# Patient Record
Sex: Male | Born: 2000 | Race: Black or African American | Hispanic: No | Marital: Single | Smoking: Never smoker
Health system: Southern US, Community
[De-identification: ages and names within clinical notes are randomized; demographics above are authoritative.]

## PROBLEM LIST (undated history)

## (undated) DIAGNOSIS — F32A Depression, unspecified: Secondary | ICD-10-CM

## (undated) DIAGNOSIS — E559 Vitamin D deficiency, unspecified: Secondary | ICD-10-CM

## (undated) DIAGNOSIS — E161 Other hypoglycemia: Secondary | ICD-10-CM

## (undated) DIAGNOSIS — R29898 Other symptoms and signs involving the musculoskeletal system: Secondary | ICD-10-CM

## (undated) DIAGNOSIS — F429 Obsessive-compulsive disorder, unspecified: Secondary | ICD-10-CM

## (undated) DIAGNOSIS — F840 Autistic disorder: Secondary | ICD-10-CM

## (undated) DIAGNOSIS — F411 Generalized anxiety disorder: Secondary | ICD-10-CM

## (undated) DIAGNOSIS — T7840XA Allergy, unspecified, initial encounter: Secondary | ICD-10-CM

## (undated) HISTORY — DX: Generalized anxiety disorder: F41.1

## (undated) HISTORY — DX: Allergy, unspecified, initial encounter: T78.40XA

## (undated) HISTORY — DX: Autistic disorder: F84.0

## (undated) HISTORY — DX: Other symptoms and signs involving the musculoskeletal system: R29.898

## (undated) HISTORY — DX: Vitamin D deficiency, unspecified: E55.9

## (undated) HISTORY — DX: Other hypoglycemia: E16.1

## (undated) HISTORY — DX: Obsessive-compulsive disorder, unspecified: F42.9

## (undated) HISTORY — DX: Depression, unspecified: F32.A

---
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
## 2022-02-15 ENCOUNTER — Ambulatory Visit (HOSPITAL_COMMUNITY)
Admission: EM | Admit: 2022-02-15 | Discharge: 2022-02-15 | Disposition: A | Discharge status: Home / Self Care | Payer: Medicaid Other | Attending: Psychiatry | Admitting: Psychiatry

## 2022-02-15 DIAGNOSIS — Z79899 Other long term (current) drug therapy: Secondary | ICD-10-CM | POA: Insufficient documentation

## 2022-02-15 DIAGNOSIS — F840 Autistic disorder: Secondary | ICD-10-CM | POA: Insufficient documentation

## 2022-02-15 DIAGNOSIS — F419 Anxiety disorder, unspecified: Secondary | ICD-10-CM | POA: Insufficient documentation

## 2022-02-15 NOTE — Progress Notes (Signed)
Patient was brought to the [facility].  Patient has been having meltdowns over the past three weeks when he comes home from his day program.  Grandmother states that he hits the wall, stomps his feet and becomes irrational  Grandmother states that they recently moved from an apartment to a house and he does not like it there and he is acting out more.  She states that he has rituals he does and when people do not go along with those, he gets really angry.  Grandmother states, "I am stressed out with him and I need a break."  Patient is not on any medication other than Vistaril for his anxiety.  She statates that patient currently has no psychiatric provider.  Patient was seeing Dr Darleene Cleaver for medications three years ago, but she states that they stopped going because patient was prescribed Topamax and he became violent.  Patient has not made any suicidal statements and he is ot homicidal or psychotic.  Patient's issues appear to be more behavioral related to his autism.  Patient is routine.

## 2022-02-15 NOTE — ED Provider Notes (Addendum)
Behavioral Health Urgent Care Medical Screening Exam  Patient Name: Christopher [name] MRN: [id number] Date of Evaluation: 02/15/22 Chief Complaint:   Diagnosis:  Final diagnoses:  Autism spectrum    History of Present illness: Christopher [name] is a 21 y.o. male. Patient presents voluntarily to [facility] for walk-in assessment.  Patient is accompanied by his grandmother, [name]. Christopher [name] prefers that his grandmother remain present during assessment.  Patient is assessed, face-to-face, by nurse practitioner, seated in assessment area, no acute distress. He  is alert and oriented, pleasant and cooperative during assessment. Speech slurred at baseline.  Christopher [name], prefers to be called "Christopher [name]."  History includes IDD, autism spectrum disorder, aggressive behavior, anxiety and OCD.  Current medications include hydroxyzine 25 mg 3 times daily as needed/anxiety.  This medication prescribed by pediatrician at [facility].  Patient has been linked with outpatient psychiatry, [name] at [facility] in the past.  No current medications aside from hydroxyzine.  Per patient's grandmother he has "an adverse reaction to 29 different psychiatric medications."  Patient's grandmother along with his mother/legal guardian meet weekly with Medicaid care coordinator, [name] with [facility].  Previously planned to place patient in [facility] however patient does not like attending day programs, negative experience at day program in the past.  Per care coordination patient would be required to attend day program while residing at alternative family living.  Currently patient attends a day support program through [facility] that allows him to remain in the office and "go out into the community" Christopher [name] enjoys this program immensely.  He states "I wish I could go every day."  Christopher [name] became very frustrated today during the drive home from his day support program.  Patient's grandmother shares  that patient is "very ritualistic and likes to have people sign his paperwork."  Patient has several pieces of blank paper that he asks day program staff members to sign at various times each day.  Today a staff member, [facility] [name], failed to sign his piece of paper.  Christopher [name] asked to return to the office however when his grandmother explained the office was closed he became very upset, began yelling and kicking in the car.  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 He states "my house is haunted and it is scary."  Christopher [name]'s grandmother is able to add details to current situation.  Christopher [name] and his family moved to a new home in April.  Since moving into a new home he reports fear of anything that flies including gnats or small fruit flies.  Patient now insists on closing the door to his bedroom very quickly because he is afraid to see a fly or a gnat. He is unaffected by flies and gnats at [facility] support program.  Patient's grandmother feels "he needs therapy to help him deal with his phobia."  He also insists on closing the front door very quickly to ensure that flies will not enter the home.  Patient  presents with euthymic mood, congruent affect. He  denies suicidal and homicidal ideations. Denies history of suicide attempts, denies history of self-harm.  Patient easily contracts verbally for safety with this Clinical research associate.    Patient  denies auditory and visual hallucinations.  Patient is able to converse coherently with goal-directed thoughts and no distractibility or preoccupation.   Objectively there is no evidence of psychosis/mania or delusional thinking.  [name] resides in [place] with his mother, grandmother and 64-year-old brother. He denies access to weapons. He receives disability benefits.  He enjoys attending  Day Support Program through [facility].. He denies alcohol and substance use. Patient endorses average sleep and appetite.  Legal guardian, Christopher [name] [phone] or  [phone].  Patient's mother/legal guardian is reluctant to seek medication management. She states  "every medicine that I have put [name] on up until now has not worked like I thought it would. We cannot deal with the consequences of a medication not working, once he was put on one medicine and it changed his whole personality. I am very skeptical about medicine, he should be able to get some other kinds of help."  Patient's grandmother, Christopher [name], agrees with plan to follow up with outpatient psychiatry. Patient and grandmother report a pleasant experience with [facility] [name] at [facility], would like to see this provider again moving forward.    Patient and family are educated and verbalize understanding of mental health resources and other crisis services in the community. They are instructed to call 911 and present to the nearest emergency room should patient experience any suicidal/homicidal ideation, auditory/visual/hallucinations, or detrimental worsening of mental health condition.    Psychiatric Specialty Exam  Presentation  General Appearance:Appropriate for Environment; Casual  Eye Contact:Fair  Speech:Normal Rate; Garbled  Speech Volume:Normal  Handedness:Right   Mood and Affect  Mood:Euthymic  Affect:Congruent   Thought Process  Thought Processes:Coherent; Goal Directed  Descriptions of Associations:Intact  Orientation:Full (Time, Place and Person)  Thought Content:Obsessions    Hallucinations:None  Ideas of Reference:None  Suicidal Thoughts:No  Homicidal Thoughts:No   Sensorium  Memory:Immediate Fair  Judgment:Impaired  Insight:Shallow   Executive Functions  Concentration:Poor  Attention Span:Poor  [place] recorded  Psychomotor Activity  Psychomotor Activity:Normal   Assets  Assets:Communication Skills; Financial Resources/Insurance; Housing; Intimacy; Leisure Time; Physical  Health; Resilience; Social Support   Sleep  Sleep:Fair  Number of hours: No data recorded  No data recorded  Physical Exam: Physical Exam Vitals and nursing note reviewed.  Constitutional:      Appearance: Normal appearance. He is well-developed.  HENT:     Head: Normocephalic and atraumatic.     Nose: Nose normal.  Cardiovascular:     Rate and Rhythm: Normal rate.  Pulmonary:     Effort: Pulmonary effort is normal.  Musculoskeletal:        General: Normal range of motion.  Skin:    General: Skin is warm and dry.  Neurological:     Mental Status: He is alert and oriented to person, place, and time.  Psychiatric:        Attention and Perception: Attention and perception normal.        Mood and Affect: Mood and affect normal.        Speech: Speech is slurred.        Behavior: Behavior normal. Behavior is cooperative.    Review of Systems  Constitutional: Negative.   HENT: Negative.    Eyes: Negative.   Respiratory: Negative.    Cardiovascular: Negative.   Gastrointestinal: Negative.   Genitourinary: Negative.   Musculoskeletal: Negative.   Skin: Negative.   Neurological: Negative.    Blood pressure 134/83, pulse 91, temperature 99.4 F (37.4 C), temperature source Oral, resp. rate 18, SpO2 99 %. There is no height or weight on file to calculate BMI.  Musculoskeletal: Strength & Muscle Tone: within normal limits Gait & Station: normal Patient leans: N/A   [facility] MSE Discharge Disposition for Follow up and Recommendations: Based on my evaluation the patient  does not appear to have an emergency medical condition and can be discharged with resources and follow up care in outpatient services for Medication Management and Individual Therapy  Patient reviewed with Dr Nelly Rout.  Follow up with established outpatient psychiatry or additional outpatient psychiatry resources provided. Continue current medications including hydroxyzine 25mg  TID PRN/anxiety.   , FNP 02/15/2022, 7:17 PM

## 2022-02-15 NOTE — Discharge Instructions (Addendum)

## 2022-05-21 HISTORY — PX: NO PAST SURGERIES: SHX2092

## 2022-07-05 ENCOUNTER — Ambulatory Visit: Payer: Medicaid Other | Admitting: Medical

## 2022-07-05 VITALS — BP 120/82 | HR 102 | Ht 76.0 in | Wt 271.6 lb

## 2022-07-05 DIAGNOSIS — F840 Autistic disorder: Secondary | ICD-10-CM

## 2022-07-05 DIAGNOSIS — E6609 Other obesity due to excess calories: Secondary | ICD-10-CM | POA: Diagnosis not present

## 2022-07-05 DIAGNOSIS — Z68.41 Body mass index (BMI) pediatric, greater than or equal to 95th percentile for age: Secondary | ICD-10-CM

## 2022-07-05 DIAGNOSIS — L83 Acanthosis nigricans: Secondary | ICD-10-CM

## 2022-07-05 DIAGNOSIS — H9325 Central auditory processing disorder: Secondary | ICD-10-CM | POA: Diagnosis not present

## 2022-07-05 NOTE — Progress Notes (Signed)
printed

## 2022-07-05 NOTE — Progress Notes (Signed)
Subjective:  Christopher [name] is a 22 y.o. male who presents for Chief Complaint  Patient presents with   get established    Get established. Needs a dentist- medicaid      Here as a new patient today.  Accompanied by mother.  He has been seeing pediatrics prior in [place].  Grew up in [place].  He lost his father last year.\  Mom is having a hard time getting him into a dentist  He has autism and has several people involved in his care with autism including [name], [facility], and has a case coordinator as well.  About 2 to 3 years ago he was seen at [name] for evaluation of quite elevated insulin.  They never really could find the cause as pituitary testing was otherwise normal.  Repeat testing finally was normal  Was seeing Dr. Jobie [name] prior for psychiatry.  He had prior gene testing for behavioral health medicines and he does not respond or react well to many medications.  Not currently seeing psychiatry  Eats a good variety, fruits and vegetables.  He and his mother have a weakness for sugary drinks but they try to do better  Regarding his current medications, uses Hydroxyzine QID as needed, during allergy season uses Zyrtec and flonase prn .  Has history of constipation uses MiraLAX as needed.  No other recent problem  No other aggravating or relieving factors.    No other c/o.  Past Medical History:  Diagnosis Date   Anxiety, generalized    Autism    Diagnosed at 29 months of age when mom noted he was not developing the same as his cousin   Hyperinsulinemia    OCD (obsessive compulsive disorder)    Tall stature    Current Outpatient Medications on File Prior to Visit  Medication Sig Dispense Refill   cetirizine (ZYRTEC) 10 MG tablet Take 10 mg by mouth daily.     fluticasone (FLONASE) 50 MCG/ACT nasal spray Place 1 spray into both nostrils daily.     hydrOXYzine (ATARAX) 50 MG tablet Take 50 mg by mouth 4 (four) times daily as needed.      polyethylene glycol powder (GLYCOLAX/MIRALAX) 17 GM/SCOOP powder Take 17 g by mouth every 7 (seven) days.     No current facility-administered medications on file prior to visit.     The following portions of the patient's history were reviewed and updated as appropriate: allergies, current medications, past family history, past medical history, past social history, past surgical history and problem list.  ROS Otherwise as in subjective above  Objective: BP 120/82   Pulse (!) 102   Ht 6' 4"$  (1.93 m)   Wt 271 lb 9.6 oz (123.2 kg)   BMI 33.06 kg/m   General appearance: alert, no distress, well developed, well nourished Neck: supple, no lymphadenopathy, no thyromegaly, no masses Heart: RRR, normal S1, S2, no murmurs Lungs: CTA bilaterally, no wheezes, rhonchi, or rales Pulses: 2+ radial pulses, 2+ pedal pulses, normal cap refill Ext: no edema   Assessment: Encounter Diagnoses  Name Primary?   Autism spectrum Yes   Auditory processing disorder    Obesity due to excess calories without serious comorbidity with body mass index (BMI) in 95th to 98th percentile for age in pediatric patient    Acanthosis nigricans      Plan: I reviewed his chart record.  We will try to work on a referral to a dentist since they are having a hard time finding a  dentist that will set up to his insurance.  He does get services through care coordinator for autism.  He has a history of elevated insulin with prior workup at [name].  I reviewed records in 2016 and 2017.  We will see him back in a few months for a fasting physical  Christopher [name] was seen today for get established.  Diagnoses and all orders for this visit:  Autism spectrum -     Ambulatory referral to Dentistry  Auditory processing disorder -     Ambulatory referral to Dentistry  Obesity due to excess calories without serious comorbidity with body mass index (BMI) in 95th to 98th percentile for age in pediatric patient -     Ambulatory  referral to Dentistry  Acanthosis nigricans   Follow up: In the next few months for fasting physical

## 2022-10-03 ENCOUNTER — Encounter: Payer: Self-pay | Admitting: Medical

## 2022-10-03 ENCOUNTER — Ambulatory Visit (INDEPENDENT_AMBULATORY_CARE_PROVIDER_SITE_OTHER): Payer: Medicaid Other | Admitting: Medical

## 2022-10-03 VITALS — BP 126/74 | HR 82 | Temp 98.8°F | Resp 16 | Ht 75.5 in | Wt 278.0 lb

## 2022-10-03 DIAGNOSIS — Z1329 Encounter for screening for other suspected endocrine disorder: Secondary | ICD-10-CM

## 2022-10-03 DIAGNOSIS — E161 Other hypoglycemia: Secondary | ICD-10-CM

## 2022-10-03 DIAGNOSIS — Z131 Encounter for screening for diabetes mellitus: Secondary | ICD-10-CM

## 2022-10-03 DIAGNOSIS — F4325 Adjustment disorder with mixed disturbance of emotions and conduct: Secondary | ICD-10-CM

## 2022-10-03 DIAGNOSIS — H9325 Central auditory processing disorder: Secondary | ICD-10-CM

## 2022-10-03 DIAGNOSIS — K921 Melena: Secondary | ICD-10-CM

## 2022-10-03 DIAGNOSIS — E569 Vitamin deficiency, unspecified: Secondary | ICD-10-CM

## 2022-10-03 DIAGNOSIS — R6250 Unspecified lack of expected normal physiological development in childhood: Secondary | ICD-10-CM

## 2022-10-03 DIAGNOSIS — E88819 Insulin resistance, unspecified: Secondary | ICD-10-CM | POA: Diagnosis not present

## 2022-10-03 DIAGNOSIS — L83 Acanthosis nigricans: Secondary | ICD-10-CM

## 2022-10-03 DIAGNOSIS — Z0000 Encounter for general adult medical examination without abnormal findings: Secondary | ICD-10-CM

## 2022-10-03 DIAGNOSIS — F840 Autistic disorder: Secondary | ICD-10-CM

## 2022-10-03 DIAGNOSIS — Z13220 Encounter for screening for lipoid disorders: Secondary | ICD-10-CM

## 2022-10-03 DIAGNOSIS — Z68.41 Body mass index (BMI) pediatric, greater than or equal to 95th percentile for age: Secondary | ICD-10-CM

## 2022-10-03 DIAGNOSIS — K5900 Constipation, unspecified: Secondary | ICD-10-CM

## 2022-10-03 LAB — LIPID PANEL
LDL Chol Calc (NIH): 94 mg/dL (ref 0–109)
VLDL Cholesterol Cal: 16 mg/dL (ref 5–40)

## 2022-10-03 LAB — CBC WITH DIFFERENTIAL/PLATELET
Hematocrit: 46.7 % (ref 37.5–51.0)
Lymphs: 43 %
MCV: 83 fL (ref 79–97)
Monocytes: 8 %
Platelets: 372 10*3/uL (ref 150–450)

## 2022-10-03 LAB — COMPREHENSIVE METABOLIC PANEL
Albumin: 4.4 g/dL (ref 4.3–5.2)
Alkaline Phosphatase: 66 IU/L (ref 51–125)
Bilirubin Total: 0.3 mg/dL (ref 0.0–1.2)
Calcium: 9.4 mg/dL (ref 8.7–10.2)

## 2022-10-03 LAB — HEMOGLOBIN A1C
Est. average glucose Bld gHb Est-mCnc: 120 mg/dL
Hgb A1c MFr Bld: 5.8 % — ABNORMAL HIGH (ref 4.8–5.6)

## 2022-10-03 MED ORDER — HYDROCORTISONE ACETATE 25 MG RE SUPP: 25.0000 mg | Freq: Two times a day (BID) | RECTAL | 0 refills | Status: DC

## 2022-10-03 MED ORDER — POLYETHYLENE GLYCOL 3350 17 GM/SCOOP PO POWD: 17.0000 g | Freq: Every day | ORAL | 2 refills | Status: DC

## 2022-10-03 NOTE — Progress Notes (Signed)
Subjective:   HPI  Christopher [name] is a 22 y.o. male who presents for Chief Complaint  Patient presents with   Annual Exam    Has additional concerns about blood in stool. Has past history of hemorrhoids. Also complains of pain under both arms.    Patient Care Team: Jalayne Ganesh, Kermit Balo, PA-C as PCP - General (Family Medicine) Freddie Apley as Speech Language Pathologist (Speech Pathology) Prior saw Dr. Starling Manns for counseling Has seen psychiatry in the past Has seen neuropsychiatry in the past with Dr. Jannifer Franklin   Concerns: Had prior [facility] test and has either had allergies to certain medication or Genesight showed some medications wouldn't help.   Some medications that should sedate him will make him aggressive. Particularly Topamax made him have erratic behavior.   He has been seeing some blood in stool.  Has had some hemorrhoids in the past.  Uses miralax some.  Was on daily, but now prn.  In the past month several times.  Doesn't seem to be in pain of late.  Sometimes stool feels a little hard.  If he uses miralax too frequently daily will get loose stool.  No complain with wiping.   Has some concerns of irritation under arms. Mom has hx/o hidradenitis.  She wants him checked.   Reviewed their medical, surgical, family, social, medication, and allergy history and updated chart as appropriate.  Allergies  Allergen Reactions   Abilify [Aripiprazole] Other (See Comments)    Caused aggression and Per "Genesight" pharmacogenomic testing, this is deemed to have a SIGNIFICANT gene-drug interaction   Anafranil [Clomipramine] Other (See Comments)    Per "Genesight" pharmacogenomic testing, this is deemed to have SIGNIFICANT gene-drug interaction   Celexa [Citalopram] Other (See Comments)    Per "Genesight" pharmacogenomic testing, this is deemed to have SIGNIFICANT gene-drug interaction   Clozaril [Clozapine] Other (See Comments)    Per "Genesight" pharmacogenomic testing, this is  deemed to have MODERATE gene-drug interaction   Concerta [Methylphenidate] Other (See Comments)    Per "Genesight" pharmacogenomic testing, this is deemed to have MODERATE gene-drug interaction   Cymbalta [Duloxetine Hcl] Other (See Comments)    Per "Genesight" pharmacogenomic testing, this is deemed to have MODERATE gene-drug interaction   Desyrel [Trazodone] Other (See Comments)    Per "Genesight" pharmacogenomic testing, this is deemed to have MODERATE gene-drug interaction   Effexor [Venlafaxine] Other (See Comments)    Per "Genesight" pharmacogenomic testing, this is deemed to have SIGNIFICANT gene-drug interaction   Elavil [Amitriptyline] Other (See Comments)    Per "Genesight" pharmacogenomic testing, this is deemed to have SIGNIFICANT gene-drug interaction   Emsam [Selegiline] Other (See Comments)    Per "Genesight" pharmacogenomic testing, this is deemed to have MODERATE gene-drug interaction   Fanapt [Iloperidone] Other (See Comments)    Per "Genesight" pharmacogenomic testing, this is deemed to have SIGNIFICANT gene-drug interaction   Focalin [Dexmethylphenidate] Other (See Comments)    Per "Genesight" pharmacogenomic testing, this is deemed to have MODERATE gene-drug interaction   Haldol [Haloperidol] Other (See Comments)    Per "Genesight" pharmacogenomic testing, this is deemed to have MODERATE gene-drug interaction   Inderal [Propranolol] Other (See Comments)    Per "Genesight" pharmacogenomic testing, this is deemed to have MODERATE gene-drug interaction   Lexapro [Escitalopram] Other (See Comments)    Per "Genesight" pharmacogenomic testing, this is deemed to have SIGNIFICANT gene-drug interaction   Luvox [Fluvoxamine] Other (See Comments)    Per "Genesight" pharmacogenomic testing, this is deemed to have MODERATE  gene-drug interaction   Mellaril [Thioridazine] Other (See Comments)    Per "Genesight" pharmacogenomic testing, this is deemed to have SIGNIFICANT gene-drug  interaction   Navane [Thiothixene (Tiotixene)] Other (See Comments)    Per "Genesight" pharmacogenomic testing, this is deemed to have SIGNIFICANT gene-drug interaction   Norpramin [Desipramine] Other (See Comments)    Per "Genesight" pharmacogenomic testing, this is deemed to have SIGNIFICANT gene-drug interaction   Other Itching and Other (See Comments)    Seasonal allergies- Itchy eyes, runny nose, some wheezing   Pamelor [Nortriptyline] Other (See Comments)    Per "Genesight" pharmacogenomic testing, this is deemed to have SIGNIFICANT gene-drug interaction   Paxil [Paroxetine] Other (See Comments)    Per "Genesight" pharmacogenomic testing, this is deemed to have SIGNIFICANT gene-drug interaction   Prolixin [Fluphenazine] Other (See Comments)    Per "Genesight" pharmacogenomic testing, this is deemed to have a moderate gene-drug interaction   Prozac [Fluoxetine] Other (See Comments)    Per "Genesight" pharmacogenomic testing, this is deemed to have SIGNIFICANT gene-drug interaction   Remeron [Mirtazapine] Other (See Comments)    Per "Genesight" pharmacogenomic testing, this is deemed to have MODERATE gene-drug interaction   Rexulti [Brexpiprazole] Other (See Comments)    Per "Genesight" pharmacogenomic testing, this is deemed to have SIGNIFICANT gene-drug interaction   Risperdal [Risperidone] Other (See Comments)    Per "Genesight" pharmacogenomic testing, this is deemed to have SIGNIFICANT gene-drug interaction   Saphris [Asenapine] Other (See Comments)    Per "Genesight" pharmacogenomic testing, this is deemed to have MODERATE gene-drug interaction   Seroquel [Quetiapine] Other (See Comments)    Per "Genesight" pharmacogenomic testing, this is deemed to have MODERATE gene-drug interaction   Sinequan [Doxepin] Other (See Comments)    Per "Genesight" pharmacogenomic testing, this is deemed to have SIGNIFICANT gene-drug interaction   Thorazine [Chlorpromazine] Other (See Comments)     Per "Genesight" pharmacogenomic testing, this is deemed to have MODERATE gene-drug interaction   Tofranil [Imipramine] Other (See Comments)    Per "Genesight" pharmacogenomic testing, this is deemed to have SIGNIFICANT gene-drug interaction   Trilafon [Perphenazine] Other (See Comments)    Per "Genesight" pharmacogenomic testing, this is deemed to have MODERATE gene-drug interaction   Trintellix [Vortioxetine] Other (See Comments)    Per "Genesight" pharmacogenomic testing, this is deemed to have SIGNIFICANT gene-drug interaction   Valium [Diazepam] Other (See Comments)    Per "Genesight" pharmacogenomic testing, this is deemed to have MODERATE gene-drug interaction   Viibryd [Vilazodone Hcl] Other (See Comments)    Per "Genesight" pharmacogenomic testing, this is deemed to have MODERATE gene-drug interaction   Wellbutrin [Bupropion] Other (See Comments)    Per "Genesight" pharmacogenomic testing, this is deemed to have SIGNIFICANT gene-drug interaction   Zoloft [Sertraline] Other (See Comments)    Per "Genesight" pharmacogenomic testing, this is deemed to have MODERATE gene-drug interaction   Zyprexa [Olanzapine] Other (See Comments)    Per "Genesight" pharmacogenomic testing, this is deemed to have MODERATE gene-drug interaction    Past Medical History:  Diagnosis Date   Allergy    Anxiety, generalized    Autism    Diagnosed at 65 months of age when mom noted he was not developing the same as his cousin   Hyperinsulinemia    eval at [facility] prior to 2024   OCD (obsessive compulsive disorder)    Tall stature     Current Outpatient Medications on File Prior to Visit  Medication Sig Dispense Refill   fluticasone (FLONASE) 50 MCG/ACT nasal  spray Place 1 spray into both nostrils daily.     hydrOXYzine (ATARAX) 50 MG tablet Take 50 mg by mouth 4 (four) times daily as needed.     polyethylene glycol powder (GLYCOLAX/MIRALAX) 17 GM/SCOOP powder Take 17 g by mouth every 7 (seven) days.      cetirizine (ZYRTEC) 10 MG tablet Take 10 mg by mouth daily. (Patient not taking: Reported on 10/03/2022)     No current facility-administered medications on file prior to visit.      Current Outpatient Medications:    fluticasone (FLONASE) 50 MCG/ACT nasal spray, Place 1 spray into both nostrils daily., Disp: , Rfl:    hydrocortisone (ANUSOL-HC) 25 MG suppository, Place 1 suppository (25 mg total) rectally 2 (two) times daily., Disp: 12 suppository, Rfl: 0   hydrOXYzine (ATARAX) 50 MG tablet, Take 50 mg by mouth 4 (four) times daily as needed., Disp: , Rfl:    polyethylene glycol powder (GLYCOLAX/MIRALAX) 17 GM/SCOOP powder, Take 17 g by mouth every 7 (seven) days., Disp: , Rfl:    polyethylene glycol powder (GLYCOLAX/MIRALAX) 17 GM/SCOOP powder, Take 17 g by mouth daily., Disp: 3350 g, Rfl: 2   cetirizine (ZYRTEC) 10 MG tablet, Take 10 mg by mouth daily. (Patient not taking: Reported on 10/03/2022), Disp: , Rfl:   Family History  Problem Relation Age of Onset   Hypertension Mother    Insulin resistance Mother    Cancer Father        renal   Kidney disease Father    Pancreatitis Father    Autism Brother    Hypertension Maternal Grandmother    Hypertension Maternal Grandfather    Diabetes Maternal Grandfather    Cancer Maternal Great-grandmother        ovarian cancer   Kidney disease Cousin     Past Surgical History:  Procedure Laterality Date   NO PAST SURGERIES  2024    Review of Systems  Constitutional:  Negative for chills, fever, malaise/fatigue and weight loss.  HENT:  Negative for congestion, ear pain, hearing loss, sore throat and tinnitus.   Eyes:  Negative for blurred vision, pain and redness.  Respiratory:  Negative for cough, hemoptysis and shortness of breath.   Cardiovascular:  Negative for chest pain, palpitations, orthopnea, claudication and leg swelling.  Gastrointestinal:  Positive for blood in stool and constipation. Negative for abdominal pain, diarrhea,  nausea and vomiting.  Genitourinary:  Negative for dysuria, flank pain, frequency, hematuria and urgency.  Musculoskeletal:  Negative for falls, joint pain and myalgias.  Skin:  Negative for itching and rash.  Neurological:  Negative for dizziness, tingling, speech change, weakness and headaches.  Endo/Heme/Allergies:  Negative for polydipsia. Does not bruise/bleed easily.  Psychiatric/Behavioral:  Negative for depression and memory loss. The patient is not nervous/anxious and does not have insomnia.      Objective:  BP 126/74   Pulse 82   Temp 98.8 F (37.1 C) (Oral)   Resp 16   Ht 6' 3.5" (1.918 m)   Wt 278 lb (126.1 kg)   SpO2 97% Comment: room air  BMI 34.29 kg/m   General appearance: alert, no distress, WD/WN, African American male Skin: unremarkable HEENT: normocephalic, conjunctiva/corneas normal, sclerae anicteric, PERRLA, EOMi, nares patent, no discharge or erythema, pharynx normal Oral cavity: MMM, tongue normal, teeth normal Neck: supple, no lymphadenopathy, no thyromegaly, no masses, normal ROM, no bruits Chest: non tender, normal shape and expansion Heart: RRR, normal S1, S2, no murmurs Lungs: CTA bilaterally, no wheezes, rhonchi, or  rales Abdomen: +bs, soft, non tender, non distended, no masses, no hepatomegaly, no splenomegaly, no bruits Back: non tender, normal ROM, no scoliosis Musculoskeletal: upper extremities non tender, no obvious deformity, normal ROM throughout, lower extremities non tender, no obvious deformity, normal ROM throughout Extremities: no edema, no cyanosis, no clubbing Pulses: 2+ symmetric, upper and lower extremities, normal cap refill Neurological: alert, oriented x 3, CN2-12 intact, strength normal upper extremities and lower extremities, sensation normal throughout, DTRs 2+ throughout, no cerebellar signs, gait normal Psychiatric: normal affect, behavior normal, pleasant  GU: deferred Rectal: anus normal appearing, on obvious hemorrhoid or  fissure. Examined with mom in the room as well.    Assessment and Plan :   Encounter Diagnoses  Name Primary?   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   Topics typically include healthy lifestyle, diet, exercise, preventative care, vaccinations, sick and well care, proper use of emergency dept and after hours care, as well as other concerns.     Separate significant issues discussed: Blood in stool, constipation - can continue miralax, can use proctors prn.  Discussed proper use.  Referral to GI  Armpit concern - no obvious abnormality today on exam  Autism - not currently seeing specialist.  He has had problems with many prior medicaiton.  Hx/o elevated insulin - additional labs today    General Recommendations: Continue to return yearly for your annual wellness and preventative care visits.  This gives [name] a chance to discuss healthy lifestyle, exercise, vaccinations, review your chart record, and perform screenings where appropriate.  I recommend you see your eye doctor yearly for routine vision care.  I recommend you see your dentist yearly for routine dental care including hygiene visits twice yearly.   Vaccination  Immunization History  Administered Date(s) Administered   DTaP 02/24/2003, 04/08/2003, 06/16/2003,  03/09/2004, 12/11/2006   HIB (PRP-OMP) 02/24/2003, 04/08/2003, 06/16/2003, 12/06/2003   HPV 9-valent 12/01/2014, 02/24/2015, 06/22/2015   Hep B, Unspecified 02/18/2020, 07/11/2020   Hepatitis A, Ped/Adol-2 Dose 06/07/2005, 12/06/2005   Hepatitis B, PED/ADOLESCENT August 08, 2002, 01/08/2003, 09/03/2003, 01/06/2020   IPV 02/24/2003, 03/09/2004, 04/07/2005, 12/11/2006   MMR 12/06/2003, 12/11/2006   Meningococcal B, Unspecified 12/15/2019, 03/01/2020   Meningococcal Conjugate 12/01/2014, 05/19/2019   PFIZER(Purple Top)SARS-COV-2 Vaccination 08/21/2019, 09/16/2019, 12/15/2020   Pneumococcal Conjugate-13 02/24/2003, 04/08/2003, 06/16/2003, 12/06/2003   Tdap 12/01/2014   Varicella 03/09/2004, 12/11/2006     Screening for cancer: Colon cancer screening: Referral to GI due to blood in stool and constipation   Testicular cancer screening You should do a monthly self testicular exam if you are between 19-59 years old, and we typically do a testicular exam on the yearly physical for this same age group.   Prostate Cancer screening: The recommended prostate cancer screening test is a blood  test called the prostate-specific antigen (PSA) test. PSA is a protein that is made in the prostate. As you age, your prostate naturally produces more PSA. Abnormally high PSA levels may be caused by: Prostate cancer. An enlarged prostate that is not caused by cancer (benign prostatic hyperplasia, or BPH). This condition is very common in older men. A prostate gland infection (prostatitis) or urinary tract infection. Certain medicines such as male hormones (like testosterone) or other medicines that raise testosterone levels. A rectal exam may be done as part of prostate cancer screening to help provide information about the size of your prostate gland. When a rectal exam is performed, it should be done after the PSA level is drawn to avoid any effect on the results.   Skin cancer screening: Check your skin  regularly for new changes, growing lesions, or other lesions of concern Come in for evaluation if you have skin lesions of concern.   Lung cancer screening: If you have a greater than 20 pack year history of tobacco use, then you may qualify for lung cancer screening with a chest CT scan.   Please call your insurance company to inquire about coverage for this test.   Pancreatic cancer:  no current screening test is available or routinely recommended. (risk factors: smoking, overweight or obese, diabetes, chronic pancreatitis, work exposure - dry cleaning, metal working, 20yo>, M>F, Tree surgeon, family hx/o, hereditary breast, ovarian, melanoma, lynch, peutz-jeghers).  Symptoms: jaundice, dark urine, light color or greasy stools, itchy skin, belly or back pain, weight loss, poor appetite, nausea, vomiting, liver enlargement, DVT/blood clots.   We currently don't have screenings for other cancers besides breast, cervical, colon, and lung cancers.  If you have a strong family history of cancer or have other cancer screening concerns, please let me know.  Genetic testing referral is an option for individuals with high cancer risk in the family.  There are some other cancer screenings in development currently.   Bone health: Get at least 150 minutes of aerobic exercise weekly Get weight bearing exercise at least once weekly Bone density test:  A bone density test is an imaging test that uses a type of X-ray to measure the amount of calcium and other minerals in your bones. The test may be used to diagnose or screen you for a condition that causes weak or thin bones (osteoporosis), predict your risk for a broken bone (fracture), or determine how well your osteoporosis treatment is working. The bone density test is recommended for females 65 and older, or females or males <65 if certain risk factors such as thyroid disease, long term use of steroids such as for asthma or rheumatological issues,  vitamin D deficiency, estrogen deficiency, family history of osteoporosis, self or family history of fragility fracture in first degree relative.    Heart health: Get at least 150 minutes of aerobic exercise weekly Limit alcohol It is important to maintain a healthy blood pressure and healthy cholesterol numbers  Heart disease screening: Screening for heart disease includes screening for blood pressure, fasting lipids, glucose/diabetes screening, BMI height to weight ratio, reviewed of smoking status, physical activity, and diet.    Goals include blood pressure 120/80 or less, maintaining a healthy lipid/cholesterol profile, preventing diabetes or keeping diabetes numbers under good control, not smoking or using tobacco products, exercising most days per week or at least 150 minutes per week of exercise, and eating healthy variety of fruits and vegetables, healthy oils, and avoiding unhealthy food choices like fried  food, fast food, high sugar and high cholesterol foods.    Other tests may possibly include EKG test, CT coronary calcium score, echocardiogram, exercise treadmill stress test.      Vascular disease screening: For higher risk individuals including smokers, diabetics, patients with known heart disease or high blood pressure, kidney disease, and others, screening for vascular disease or atherosclerosis of the arteries is available.  Examples may include carotid ultrasound, abdominal aortic ultrasound, ABI blood flow screening in the legs, thoracic aorta screening.    Medical care options: I recommend you continue to seek care here first for routine care.  We try really hard to have available appointments Monday through Friday daytime hours for sick visits, acute visits, and physicals.  Urgent care should be used for after hours and weekends for significant issues that cannot wait till the next day.  The emergency department should be used for significant potentially life-threatening  emergencies.  The emergency department is expensive, can often have long wait times for less significant concerns, so try to utilize primary care, urgent care, or telemedicine when possible to avoid unnecessary trips to the emergency department.  Virtual visits and telemedicine have been introduced since the pandemic started in 2020, and can be convenient ways to receive medical care.  We offer virtual appointments as well to assist you in a variety of options to seek medical care.   Legal  Take the time to do a last will and testament, Advanced Directives including Health Care Power of Attorney and Living Will documents.  Don't leave your family with burdens that can be handled ahead of time.   Advanced Directives: I recommend you consider completing a Health Care Power of Attorney and Living Will.   These documents respect your wishes and help alleviate burdens on your loved ones if you were to become terminally ill or be in a position to need those documents enforced.    You can complete Advanced Directives yourself, have them notarized, then have copies made for our office, for you and for anybody you feel should have them in safe keeping.  Or, you can have an attorney prepare these documents.   If you haven't updated your Last Will and Testament in a while, it may be worthwhile having an attorney prepare these documents together and save on some costs.       Spiritual and Emotional Health Keeping a healthy spiritual life can help you better manage your physical health. Your spiritual life can help you to cope with any issues that may arise with your physical health.  Balance can keep [name] healthy and help [name] to recover.  If you are struggling with your spiritual health there are questions that you may want to ask yourself:  What makes me feel most complete? When do I feel most connected to the rest of the world? Where do I find the most inner strength? What am I doing when I feel  whole?  Helpful tips: Being in nature. Some people feel very connected and at peace when they are walking outdoors or are outside. Helping others. Some feel the largest sense of wellbeing when they are of service to others. Being of service can take on many forms. It can be doing volunteer work, being kind to strangers, or offering a hand to a friend in need. Gratitude. Some people find they feel the most connected when they remain grateful. They may make lists of all the things they are grateful for or say a thank  you out loud for all they have.    Emotional Health Are you in tune with your emotional health?  Check out this link: http://www.marquez-love.[email or web address]/    Financial Health Make sure you use a budget for your personal finances Make sure you are insured against risks (health insurance, life insurance, auto insurance, etc) Save more, spend less Set financial goals If you need help in this area, good resources include counseling through [employer] or other community resources, have a meeting with a Social research officer, government, and a good resource is the [employer] was seen today for annual exam.  Diagnoses and all orders for this visit:  Encounter for health maintenance examination in adult -     Comprehensive metabolic panel -     CBC with Differential/Platelet -     Lipid panel -     Hemoglobin A1c -     TSH -     Prolactin -     POCT Urinalysis DIP (Proadvantage Device) -     Insulin, random -     VITAMIN D 25 Hydroxy (Vit-D Deficiency, Fractures) -     Ambulatory referral to Gastroenterology  Insulin resistance -     Hemoglobin A1c -     Insulin, random  Screening for diabetes mellitus -     Hemoglobin A1c  Acanthosis nigricans  Screening for lipid disorders -     Lipid panel  Screening for thyroid disorder -     TSH  Vitamin deficiency -     Prolactin -     Insulin, random -     VITAMIN D 25 Hydroxy (Vit-D Deficiency,  Fractures)  Hyperinsulinism  Autism spectrum  Auditory processing disorder  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 -     polyethylene glycol powder (GLYCOLAX/MIRALAX) 17 GM/SCOOP powder; Take 17 g by mouth daily.    Follow-up pending labs, yearly for physical

## 2022-10-04 ENCOUNTER — Other Ambulatory Visit: Payer: Self-pay | Admitting: Medical

## 2022-10-04 LAB — INSULIN, RANDOM: INSULIN: 92.7 u[IU]/mL — ABNORMAL HIGH (ref 2.6–24.9)

## 2022-10-04 LAB — COMPREHENSIVE METABOLIC PANEL
ALT: 43 IU/L (ref 0–44)
AST: 25 IU/L (ref 0–40)
Albumin/Globulin Ratio: 1.6 (ref 1.2–2.2)
BUN/Creatinine Ratio: 8 — ABNORMAL LOW (ref 9–20)
BUN: 7 mg/dL (ref 6–20)
CO2: 21 mmol/L (ref 20–29)
Chloride: 102 mmol/L (ref 96–106)
Creatinine, Ser: 0.89 mg/dL (ref 0.76–1.27)
Globulin, Total: 2.7 g/dL (ref 1.5–4.5)
Glucose: 84 mg/dL (ref 70–99)
Potassium: 4.8 mmol/L (ref 3.5–5.2)
Sodium: 137 mmol/L (ref 134–144)
Total Protein: 7.1 g/dL (ref 6.0–8.5)
eGFR: 127 mL/min/{1.73_m2} (ref >59)

## 2022-10-04 LAB — LIPID PANEL
Chol/HDL Ratio: 4.1 ratio (ref 0.0–5.0)
Cholesterol, Total: 145 mg/dL (ref 100–169)
HDL: 35 mg/dL — ABNORMAL LOW (ref >39)
Triglycerides: 83 mg/dL (ref 0–89)

## 2022-10-04 LAB — PROLACTIN: Prolactin: 16.5 ng/mL (ref 3.6–31.5)

## 2022-10-04 LAB — CBC WITH DIFFERENTIAL/PLATELET
Basophils Absolute: 0 10*3/uL (ref 0.0–0.2)
Basos: 1 %
EOS (ABSOLUTE): 0.1 10*3/uL (ref 0.0–0.4)
Eos: 2 %
Hemoglobin: 15.9 g/dL (ref 13.0–17.7)
Immature Grans (Abs): 0.1 10*3/uL (ref 0.0–0.1)
Immature Granulocytes: 1 %
Lymphocytes Absolute: 2.7 10*3/uL (ref 0.7–3.1)
MCH: 28.2 pg (ref 26.6–33.0)
MCHC: 34 g/dL (ref 31.5–35.7)
Monocytes Absolute: 0.5 10*3/uL (ref 0.1–0.9)
Neutrophils Absolute: 3 10*3/uL (ref 1.4–7.0)
Neutrophils: 45 %
RBC: 5.63 x10E6/uL (ref 4.14–5.80)
RDW: 13.3 % (ref 11.6–15.4)
WBC: 6.4 10*3/uL (ref 3.4–10.8)

## 2022-10-04 LAB — TSH: TSH: 2.24 u[IU]/mL (ref 0.450–4.500)

## 2022-10-04 LAB — VITAMIN D 25 HYDROXY (VIT D DEFICIENCY, FRACTURES): Vit D, 25-Hydroxy: 11.5 ng/mL — ABNORMAL LOW (ref 30.0–100.0)

## 2022-10-04 MED ORDER — VITAMIN D (ERGOCALCIFEROL) 1.25 MG (50000 UNIT) PO CAPS: 50000.0000 [IU] | ORAL_CAPSULE | ORAL | 3 refills | Status: DC

## 2022-10-04 NOTE — Progress Notes (Signed)
Sabrina-please call mom about these findings.  This patient is autistic   labs showed normal blood sugar, liver, kidney, electrolytes, normal blood counts.  Thyroid normal.  Prolactin pituitary hormone normal, but insulin still elevated as in the past.  Vitamin D was quite low.  Diabetes marker 5.8% which is somewhat at risk for diabetes.  Cholesterol okay except for good cholesterol HDL too low.  Vitamin D level is low. I would like you to begin prescription Vitamin D 50,000 IU weekly.  We will plan to check vitamin D periodically along with calcium, such as repeating labs in 3 months.  Foods that contain vitamin D include seafood such as oysters, shrimp, salmon, herring, cod, egg yolks, mushrooms, milk, and foods fortified with Vitamin D such as orange juice, cereals.  Its also important to get some sun exposure regularly to absorb vitamin D.  How you feel about a consult with a nutritionist.  I nothing is a bad idea to do a visit with the nutritionist to go over diet in relation to your labs  I reviewed some limited information in Care Everywhere from prior evaluation about the elevated insulin.  There apparently was some eval from [place] at 1 point did not have access to this.  Lets follow-up in 3 months

## 2022-10-06 LAB — SPECIMEN STATUS REPORT

## 2022-10-06 LAB — VITAMIN B12: Vitamin B-12: 276 pg/mL (ref 232–1245)

## 2022-10-06 NOTE — Progress Notes (Signed)
B12 was normal

## 2022-11-09 ENCOUNTER — Encounter: Payer: Self-pay | Admitting: Gastroenterology

## 2022-12-11 ENCOUNTER — Encounter: Payer: Self-pay | Admitting: Gastroenterology

## 2022-12-11 ENCOUNTER — Ambulatory Visit (INDEPENDENT_AMBULATORY_CARE_PROVIDER_SITE_OTHER): Payer: MEDICAID | Admitting: Gastroenterology

## 2022-12-11 VITALS — BP 104/68 | HR 84 | Ht 75.5 in | Wt 284.4 lb

## 2022-12-11 DIAGNOSIS — K625 Hemorrhage of anus and rectum: Secondary | ICD-10-CM

## 2022-12-11 DIAGNOSIS — K602 Anal fissure, unspecified: Secondary | ICD-10-CM

## 2022-12-11 DIAGNOSIS — K5900 Constipation, unspecified: Secondary | ICD-10-CM

## 2022-12-11 MED ORDER — CITRUCEL PO POWD: 1.0000 | Freq: Every day | ORAL | Status: AC

## 2022-12-11 MED ORDER — DILTIAZEM GEL 2 %: CUTANEOUS | 1 refills | Status: DC

## 2022-12-11 NOTE — Progress Notes (Signed)
HPI :  22 y/o male with a history of autism, anxiety, vitamin D deficiency, referred by Crosby Oyster PA for rectal bleeding.  He is accompanied by his mother today.  This is his first time seeing our office.  He has been experiencing blood in his stools for the past year.  Majority of history provided by his mother.  States this happens intermittently.  When she sees the blood the volume can vary but she has seen significant blood in the toilet bowl at times, and in the stool, as well as on the toilet paper.  She is not sure exactly how frequently it is happening as he goes to the bathroom without her assistance.  He was told he may have some hemorrhoids and was ordered some suppositories however she states insurance did not cover it and she was not able to pick it up.  He does have some occasional hard stools.  They state that he does have a bowel movement perhaps once daily.  He has been using MiraLAX on average 2 times per week, he still has some hard stools at times.  They are concerned about loose stools if using too frequently.  There is no family history of colon cancer.  He denies any significant rectal pain, states he does have some discomfort there at times.  No abdominal pains otherwise.  He has never had a prior colonoscopy.  He denies any cardiopulmonary symptoms or comorbidities of the leg.  Mother endorses history of vitamin D deficiency.  He had a CBC in May showing hemoglobin of 15.9 with an MCV of 83.   Lab Results  Component Value Date   WBC 6.4 10/03/2022   HGB 15.9 10/03/2022   HCT 46.7 10/03/2022   MCV 83 10/03/2022   PLT 372 10/03/2022     Past Medical History:  Diagnosis Date   Allergy    Anxiety, generalized    Autism    Diagnosed at 95 months of age when mom noted he was not developing the same as his cousin   Depression    Hyperinsulinemia    eval at [facility] prior to 2024   OCD (obsessive compulsive disorder)    Tall stature      Past Surgical  History:  Procedure Laterality Date   NO PAST SURGERIES  2024   Family History  Problem Relation Age of Onset   Hypertension Mother    Insulin resistance Mother    Kidney cancer Father    Kidney disease Father    Pancreatitis Father    Hypertension Maternal Grandmother    Hypothyroidism Maternal Grandmother    Atrial fibrillation Maternal Grandmother    Hypertension Maternal Grandfather    Diabetes Maternal Grandfather    Kidney failure Maternal Grandfather    Kidney disease Cousin    Cancer Maternal Great-grandmother        ovarian cancer   Social History   Tobacco Use   Smoking status: Never   Smokeless tobacco: Never  Vaping Use   Vaping status: Never Used  Substance Use Topics   Alcohol use: Never   Drug use: Never   Current Outpatient Medications  Medication Sig Dispense Refill   cetirizine (ZYRTEC) 10 MG tablet Take 10 mg by mouth as needed.     fluticasone (FLONASE) 50 MCG/ACT nasal spray Place 1 spray into both nostrils as needed.     hydrOXYzine (ATARAX) 50 MG tablet Take 50 mg by mouth 4 (four) times daily as needed.  polyethylene glycol powder (GLYCOLAX/MIRALAX) 17 GM/SCOOP powder Take 17 g by mouth as needed.     polyethylene glycol powder (GLYCOLAX/MIRALAX) 17 GM/SCOOP powder Take 17 g by mouth daily. 3350 g 2   Vitamin D, Ergocalciferol, (DRISDOL) 1.25 MG (50000 UNIT) CAPS capsule Take 1 capsule (50,000 Units total) by mouth every 7 (seven) days. 12 capsule 3   No current facility-administered medications for this visit.   Allergies  Allergen Reactions   Abilify [Aripiprazole] Other (See Comments)    Caused aggression and Per "[employer]" pharmacogenomic testing, this is deemed to have a SIGNIFICANT gene-drug interaction   Anafranil [Clomipramine] Other (See Comments)    Per "[employer]" pharmacogenomic testing, this is deemed to have SIGNIFICANT gene-drug interaction   Celexa [Citalopram] Other (See Comments)    Per "[employer]" pharmacogenomic  testing, this is deemed to have SIGNIFICANT gene-drug interaction   Clozaril [Clozapine] Other (See Comments)    Per "[employer]" pharmacogenomic testing, this is deemed to have MODERATE gene-drug interaction   Concerta [Methylphenidate] Other (See Comments)    Per "[employer]" pharmacogenomic testing, this is deemed to have MODERATE gene-drug interaction   Cymbalta [Duloxetine Hcl] Other (See Comments)    Per "[employer]" pharmacogenomic testing, this is deemed to have MODERATE gene-drug interaction   Desyrel [Trazodone] Other (See Comments)    Per "[employer]es[employer]" pharmacogenomic testing, this is deemed to have MODERATE gene-drug interaction   Effexor [Venlafaxine] Other (See Comments)    Per "[employer]" pharmacogenomic testing, this is deemed to have SIGNIFICANT gene-drug interaction   Elavil [Amitriptyline] Other (See Comments)    Per "[employer]" pharmacogenomic testing, this is deemed to have SIGNIFICANT gene-drug interaction   Emsam [Selegiline] Other (See Comments)    Per "[employer]" pharmacogenomic testing, this is deemed to have MODERATE gene-drug interaction   Fanapt [Iloperidone] Other (See Comments)    Per "[employer]" pharmacogenomic testing, this is deemed to have SIGNIFICANT gene-drug interaction   Focalin [Dexmethylphenidate] Other (See Comments)    Per "[employer]" pharmacogenomic testing, this is deemed to have MODERATE gene-drug interaction   Haldol [Haloperidol] Other (See Comments)    Per "[employer]" pharmacogenomic testing, this is deemed to have MODERATE gene-drug interaction   Inderal [Propranolol] Other (See Comments)    Per "[employer]es[employer]" pharmacogenomic testing, this is deemed to have MODERATE gene-drug interaction   Lexapro [Escitalopram] Other (See Comments)    Per "[employer]" pharmacogenomic testing, this is deemed to have SIGNIFICANT gene-drug interaction   Luvox [Fluvoxamine] Other (See Comments)    Per "[employer]" pharmacogenomic testing, this is deemed to have MODERATE  gene-drug interaction   Mellaril [Thioridazine] Other (See Comments)    Per "[employer]" pharmacogenomic testing, this is deemed to have SIGNIFICANT gene-drug interaction   Navane [Thiothixene (Tiotixene)] Other (See Comments)    Per "[employer]" pharmacogenomic testing, this is deemed to have SIGNIFICANT gene-drug interaction   Norpramin [Desipramine] Other (See Comments)    Per "[employer]" pharmacogenomic testing, this is deemed to have SIGNIFICANT gene-drug interaction   Other Itching and Other (See Comments)    Seasonal allergies- Itchy eyes, runny nose, some wheezing   Pamelor [Nortriptyline] Other (See Comments)    Per "[employer]" pharmacogenomic testing, this is deemed to have SIGNIFICANT gene-drug interaction   Paxil [Paroxetine] Other (See Comments)    Per "[employer]" pharmacogenomic testing, this is deemed to have SIGNIFICANT gene-drug interaction   Prolixin [Fluphenazine] Other (See Comments)    Per "[employer]" pharmacogenomic testing, this is deemed to have a moderate gene-drug interaction   Prozac [Fluoxetine] Other (See Comments)    Per "[employer]es[employer]"  pharmacogenomic testing, this is deemed to have SIGNIFICANT gene-drug interaction   Remeron [Mirtazapine] Other (See Comments)    Per "[employer]" pharmacogenomic testing, this is deemed to have MODERATE gene-drug interaction   Rexulti [Brexpiprazole] Other (See Comments)    Per "[employer]es[employer]" pharmacogenomic testing, this is deemed to have SIGNIFICANT gene-drug interaction   Risperdal [Risperidone] Other (See Comments)    Per "[employer]es[employer]" pharmacogenomic testing, this is deemed to have SIGNIFICANT gene-drug interaction   Saphris [Asenapine] Other (See Comments)    Per "[employer]es[employer]" pharmacogenomic testing, this is deemed to have MODERATE gene-drug interaction   Seroquel [Quetiapine] Other (See Comments)    Per "[employer]es[employer]" pharmacogenomic testing, this is deemed to have MODERATE gene-drug interaction   Sinequan [Doxepin] Other (See  Comments)    Per "[employer]es[employer]" pharmacogenomic testing, this is deemed to have SIGNIFICANT gene-drug interaction   Thorazine [Chlorpromazine] Other (See Comments)    Per "[employer]esight" pharmacogenomic testing, this is deemed to have MODERATE gene-drug interaction   Tofranil [Imipramine] Other (See Comments)    Per "[employer]es[employer]" pharmacogenomic testing, this is deemed to have SIGNIFICANT gene-drug interaction   Trilafon [Perphenazine] Other (See Comments)    Per "[employer]es[employer]" pharmacogenomic testing, this is deemed to have MODERATE gene-drug interaction   Trintellix [Vortioxetine] Other (See Comments)    Per "[employer]" pharmacogenomic testing, this is deemed to have SIGNIFICANT gene-drug interaction   Valium [Diazepam] Other (See Comments)    Per "[employer]es[employer]" pharmacogenomic testing, this is deemed to have MODERATE gene-drug interaction   Viibryd [Vilazodone Hcl] Other (See Comments)    Per "[employer]es[employer]" pharmacogenomic testing, this is deemed to have MODERATE gene-drug interaction   Wellbutrin [Bupropion] Other (See Comments)    Per "[employer]esight" pharmacogenomic testing, this is deemed to have SIGNIFICANT gene-drug interaction   Zoloft [Sertraline] Other (See Comments)    Per "[employer]es[employer]" pharmacogenomic testing, this is deemed to have MODERATE gene-drug interaction   Zyprexa [Olanzapine] Other (See Comments)    Per "[employer]es[employer]" pharmacogenomic testing, this is deemed to have MODERATE gene-drug interaction     Review of Systems: All systems reviewed and negative except where noted in HPI.   Lab Results  Component Value Date   WBC 6.4 10/03/2022   HGB 15.9 10/03/2022   HCT 46.7 10/03/2022   MCV 83 10/03/2022   PLT 372 10/03/2022     Physical Exam: BP 104/68 (BP Location: Left Arm, Patient Position: Sitting, Cuff Size: Large)   Pulse 84   Ht 6' 3.5" (1.918 m) Comment: height measured without shoes  Wt 284 lb 6 oz (129 kg)   BMI 35.08 kg/m  Constitutional: Pleasant,well-developed, male in no  acute distress. HEENT: Normocephalic and atraumatic. Conjunctivae are normal. No scleral icterus. Neck supple.  Cardiovascular: Normal rate, regular rhythm.  Pulmonary/chest: Effort normal and breath sounds normal.  Abdominal: Soft, nondistended, nontender.  There are no masses palpable. No hepatomegaly. DRE - Tedra Senegal CMA standby - posterior midleine anal fissure noted, hard stool in vault, anoscopy not performed Extremities: no edema Lymphadenopathy: No cervical adenopathy noted. Neurological: Alert and oriented to person place and time. Skin: Skin is warm and dry. No rashes noted. Psychiatric: Normal mood and affect. Behavior is normal.   ASSESSMENT: 22 y.o. male here for assessment of the following  1. Rectal bleeding   2. Constipation, unspecified constipation type   3. Anal fissure    Intermittent rectal bleeding for the past year in the setting of intermittent constipation.  CBC in May shows no anemia, hemoglobin normal.  DRE performed today which shows a  large posterior midline anal fissure.  I suspect this is the likely source of his bleeding symptoms.  No obvious external hemorrhoids noted.  Anoscopy not performed given findings of the fissure and potential to make that worse.  Had a long discussion with the patient and his mother about this.  They are concerned about having some loose stools with the MiraLAX.  Recommend trial of fiber in this light, will use Citrucel once daily to see if he can keep stools soft and minimize straining etc.  For the fissure recommend topical diltiazem/lidocaine ointment, pea-sized amount applied 3 times daily.  [facility]  Hopefully covered by insurance.  Had a discussion with the patient's mother on how to apply this, he will have a hard time doing this himself and mother feels that she will likely need to do this.  Will try to do it at least twice daily for a few weeks and see if this will help his symptoms.  If  his symptoms persist despite management of the fissure or worsen, we discussed potential role of colonoscopy/flex sig to further evaluate and ensure no other pathology.  Hopefully that is not needed.  If his symptoms persist over the next several weeks they will contact me for further assessment.  All questions answered they agree with plan.  PLAN: - Citrucel once daily OTC - can use Miralax PRN as well - prescribe Diltiazem / lidocaine ointment - pea sized amount PR TID - 1 tube with refill - if no improvement after 4 weeks then contact me. Consider colonoscopy pending his course if symptoms persist  Harlin Rain, MD  Gastroenterology  CC: Aleen Campi Kermit Balo, PA-C

## 2022-12-11 NOTE — Patient Instructions (Addendum)
_______________________________________________________  If your blood pressure at your visit was 140/90 or greater, please contact your primary care physician to follow up on this.  _______________________________________________________  If you are age 20 or older, your body mass index should be between 23-30. Your Body mass index is 35.08 kg/m. If this is out of the aforementioned range listed, please consider follow up with your Primary Care Provider.  If you are age 39 or younger, your body mass index should be between 19-25. Your Body mass index is 35.08 kg/m. If this is out of the aformentioned range listed, please consider follow up with your Primary Care Provider.   ________________________________________________________  The [facility] providers would like to encourage you to use [facility] to communicate with providers for non-urgent requests or questions.  Due to long hold times on the telephone, sending your provider a message by [facility] may be a faster and more efficient way to get a response.  Please allow 48 business hours for a response.  Please remember that this is for non-urgent requests.  _______________________________________________________   We have sent a prescription for Diltiazem gel to [facility]. You should apply a pea size amount to your rectum three times daily .  [facility]'s information is below: Address: [address], [place], [place] [address]  [phone]  *Please DO NOT go directly from our office to pick up this medication! Give the pharmacy 1 day to process the prescription as this is compounded and takes time to make.  Please purchase the following medications over the counter and take as directed: Citrucel : take once daily Please contact us in four weeks if your symptoms does not resolve.  Thank you for entrusting me with your care and for choosing [facility], Dr. Ileene Patrick

## 2022-12-14 ENCOUNTER — Telehealth: Payer: Self-pay | Admitting: Gastroenterology

## 2022-12-14 NOTE — Telephone Encounter (Signed)
Message routed to the PA Team.

## 2022-12-14 NOTE — Telephone Encounter (Signed)
I have called both numbers in his chart and I left  him a detailed message per [facility] this is never approved thru insurance because it is a compound rx. [facility] said it runs between $40-50. I told him to call back with any questions.

## 2022-12-14 NOTE — Telephone Encounter (Signed)
Pt needs a PA for diltiazem med. Ph# [phone]

## 2023-01-23 ENCOUNTER — Ambulatory Visit: Payer: MEDICAID | Admitting: Medical

## 2023-01-23 VITALS — BP 132/84 | HR 110 | Temp 98.7°F | Wt 287.6 lb

## 2023-01-23 DIAGNOSIS — L732 Hidradenitis suppurativa: Secondary | ICD-10-CM

## 2023-01-23 DIAGNOSIS — Z131 Encounter for screening for diabetes mellitus: Secondary | ICD-10-CM

## 2023-01-23 DIAGNOSIS — L723 Sebaceous cyst: Secondary | ICD-10-CM | POA: Diagnosis not present

## 2023-01-23 DIAGNOSIS — Z68.41 Body mass index (BMI) pediatric, greater than or equal to 95th percentile for age: Secondary | ICD-10-CM

## 2023-01-23 DIAGNOSIS — H9325 Central auditory processing disorder: Secondary | ICD-10-CM

## 2023-01-23 DIAGNOSIS — E6609 Other obesity due to excess calories: Secondary | ICD-10-CM | POA: Diagnosis not present

## 2023-01-23 DIAGNOSIS — E559 Vitamin D deficiency, unspecified: Secondary | ICD-10-CM | POA: Diagnosis not present

## 2023-01-23 DIAGNOSIS — F840 Autistic disorder: Secondary | ICD-10-CM

## 2023-01-23 DIAGNOSIS — E161 Other hypoglycemia: Secondary | ICD-10-CM

## 2023-01-23 LAB — POCT URINALYSIS DIP (PROADVANTAGE DEVICE)
Bilirubin, UA: NEGATIVE
Blood, UA: NEGATIVE
Glucose, UA: NEGATIVE mg/dL
Ketones, POC UA: NEGATIVE mg/dL
Leukocytes, UA: NEGATIVE
Nitrite, UA: NEGATIVE
Protein Ur, POC: NEGATIVE mg/dL
Specific Gravity, Urine: 1.01
Urobilinogen, Ur: NEGATIVE
pH, UA: 8 (ref 5.0–8.0)

## 2023-01-23 NOTE — Addendum Note (Signed)
Addended by: Herminio Commons A on: 01/23/2023 02:08 PM   Modules accepted: Orders

## 2023-01-23 NOTE — Progress Notes (Signed)
Subjective:  Christopher [name] is a 22 y.o. male who presents for Chief Complaint  Patient presents with   knot on head    Knot on head. Grandma noticed this yesterday, he's not sure on pain . They need a refill on hydoxyzine     Here with mother and grandmother for concerns.  Recently they noticed a knot on the back of his head.  Not particularly tender but they have not seen it before.  Wanted this checked out.  At his physical visit a few months ago he was very well on vitamin D.  He has been doing the weekly vitamin D prescription.  Here for recheck on this.  He is gaining weight and they are concerned about ways that he can lose weight.  He eats a lot.  He has not been exercising as much.  He gets bothered by the bugs outside.  He does go to the [facility] but has not been exercising lately with the indoor track  He was on metformin in the past with has been on that in a while.  He has a history of hidradenitis with occasional flare  He has been had prediabetes and elevated insulin in the past.  He saw gastroenterology recently.  They diagnosed him with aphasia.  He was put on Citrucel supplement due to constipation.  No other aggravating or relieving factors.    No other c/o.  Past Medical History:  Diagnosis Date   Allergy    Anxiety, generalized    Autism    Diagnosed at 25 months of age when mom noted he was not developing the same as his cousin   Depression    Hyperinsulinemia    eval at [facility] prior to 2024   OCD (obsessive compulsive disorder)    Tall stature    Current Outpatient Medications on File Prior to Visit  Medication Sig Dispense Refill   cetirizine (ZYRTEC) 10 MG tablet Take 10 mg by mouth as needed.     diltiazem 2 % GEL Diltiazem gel with lidocaine: use a pea size amount per rectum three times a day until symptoms   resolve 30 g 1   fluticasone (FLONASE) 50 MCG/ACT nasal spray Place 1 spray into both nostrils as needed.     hydrOXYzine (ATARAX) 50 MG tablet  Take 50 mg by mouth 4 (four) times daily as needed.     methylcellulose (CITRUCEL) oral powder Take 1 packet by mouth daily.     Vitamin D, Ergocalciferol, (DRISDOL) 1.25 MG (50000 UNIT) CAPS capsule Take 1 capsule (50,000 Units total) by mouth every 7 (seven) days. 12 capsule 3   polyethylene glycol powder (GLYCOLAX/MIRALAX) 17 GM/SCOOP powder Take 17 g by mouth daily. (Patient not taking: Reported on 01/23/2023) 3350 g 2   No current facility-administered medications on file prior to visit.     The following portions of the patient's history were reviewed and updated as appropriate: allergies, current medications, past family history, past medical history, past social history, past surgical history and problem list.  ROS Otherwise as in subjective above  Objective: BP 132/84   Pulse (!) 110   Temp 98.7 F (37.1 C)   Wt 287 lb 9.6 oz (130.5 kg)   BMI 35.47 kg/m   General appearance: alert, no distress, well developed, well nourished Neck: supple, no lymphadenopathy, no thyromegaly, no masses Occipital scalp with a approximately 1.5 cm not suggestive of sebaceous cyst.  No obvious lymphadenopathy.  No drainage no redness no warmth.  No fluctuance.    Assessment: Encounter Diagnoses  Name Primary?   Hidradenitis Yes   Sebaceous cyst    Vitamin D deficiency    Screening for diabetes mellitus    Auditory processing disorder    Autism spectrum    Hyperinsulinism    Obesity due to excess calories without serious comorbidity with body mass index (BMI) in 95th to 98th percentile for age in pediatric patient      Plan: Hidradenitis-no recent concerns.  Consider going back on metformin as a preventative measure along with efforts to lose weight  We discussed the knot on the back of the scalp that looks most like a sebaceous cyst.  Reassured.  We discussed possible changes that a cyst can have such as infection but sometimes they can resolve as well.  Updated labs to screen for  diabetes today.  Vitamin D deficiency-updated labs since he has been on supplement for the last few months  Obesity-I will touch base with his endocrinologist about possibly going back on metformin to help with blood sugar control and hidradenitis or a GLP-1 medication for weight loss.  Counseled on exercise such as getting back into walking indoor track at the [facility] was seen today for knot on head.  Diagnoses and all orders for this visit:  Hidradenitis  Sebaceous cyst  Vitamin D deficiency -     VITAMIN D 25 Hydroxy (Vit-D Deficiency, Fractures) -     Basic metabolic panel  Screening for diabetes mellitus -     Hemoglobin A1c -     Basic metabolic panel  Auditory processing disorder  Autism spectrum  Hyperinsulinism  Obesity due to excess calories without serious comorbidity with body mass index (BMI) in 95th to 98th percentile for age in pediatric patient    Follow up: pending labs

## 2023-01-24 ENCOUNTER — Other Ambulatory Visit: Payer: Self-pay | Admitting: Medical

## 2023-01-24 DIAGNOSIS — E161 Other hypoglycemia: Secondary | ICD-10-CM

## 2023-01-24 LAB — BASIC METABOLIC PANEL
BUN/Creatinine Ratio: 5 — ABNORMAL LOW (ref 9–20)
BUN: 5 mg/dL — ABNORMAL LOW (ref 6–20)
CO2: 23 mmol/L (ref 20–29)
Calcium: 9.5 mg/dL (ref 8.7–10.2)
Chloride: 103 mmol/L (ref 96–106)
Creatinine, Ser: 0.96 mg/dL (ref 0.76–1.27)
Glucose: 96 mg/dL (ref 70–99)
Potassium: 4.4 mmol/L (ref 3.5–5.2)
Sodium: 140 mmol/L (ref 134–144)
eGFR: 116 mL/min/{1.73_m2} (ref >59)

## 2023-01-24 LAB — HEMOGLOBIN A1C
Est. average glucose Bld gHb Est-mCnc: 117 mg/dL
Hgb A1c MFr Bld: 5.7 % — ABNORMAL HIGH (ref 4.8–5.6)

## 2023-01-24 LAB — VITAMIN D 25 HYDROXY (VIT D DEFICIENCY, FRACTURES): Vit D, 25-Hydroxy: 34 ng/mL (ref 30.0–100.0)

## 2023-01-24 MED ORDER — METFORMIN HCL 500 MG PO TABS: 500.0000 mg | ORAL_TABLET | Freq: Every day | ORAL | 0 refills | Status: DC

## 2023-01-24 NOTE — Progress Notes (Signed)
Hello [name]  I saw our mutual patient yesterday.  How do you feel about either metformin to help control blood sugars or starting him on a medicine like Wegovy or Zepbound if insurance will cover this to help with weight loss and sugar control?  I told the family I would reach out to you about this  Thanks Vincenza Hews

## 2023-01-24 NOTE — Progress Notes (Signed)
After speaking with endocrinology lets have Christopher [name] begin metformin once a day for the next 30 days.  Lets see him back in 1 month to discuss and at that point we can try to get other medicines approved by insurance  I also am placing referral to adult endocrinology as he has aged out of pediatric endocrinology

## 2023-01-24 NOTE — Progress Notes (Signed)
Diabetes marker stable but at risk for diabetes.  Electrolytes okay.  Vitamin D improved.  Continue the same vitamin D supplement.  I will reach out to endocrinology as we discussed yesterday

## 2023-02-05 ENCOUNTER — Ambulatory Visit (INDEPENDENT_AMBULATORY_CARE_PROVIDER_SITE_OTHER): Payer: Self-pay | Admitting: Family

## 2023-02-20 ENCOUNTER — Encounter: Payer: Self-pay | Admitting: Medical

## 2023-02-20 ENCOUNTER — Telehealth: Payer: Self-pay | Admitting: Gastroenterology

## 2023-02-20 ENCOUNTER — Ambulatory Visit (INDEPENDENT_AMBULATORY_CARE_PROVIDER_SITE_OTHER): Payer: MEDICAID | Admitting: Medical

## 2023-02-20 VITALS — BP 120/80 | HR 89 | Wt 282.2 lb

## 2023-02-20 DIAGNOSIS — Z6834 Body mass index (BMI) 34.0-34.9, adult: Secondary | ICD-10-CM | POA: Insufficient documentation

## 2023-02-20 DIAGNOSIS — E161 Other hypoglycemia: Secondary | ICD-10-CM | POA: Diagnosis not present

## 2023-02-20 DIAGNOSIS — R7301 Impaired fasting glucose: Secondary | ICD-10-CM

## 2023-02-20 DIAGNOSIS — L83 Acanthosis nigricans: Secondary | ICD-10-CM

## 2023-02-20 DIAGNOSIS — Z23 Encounter for immunization: Secondary | ICD-10-CM | POA: Diagnosis not present

## 2023-02-20 DIAGNOSIS — E559 Vitamin D deficiency, unspecified: Secondary | ICD-10-CM

## 2023-02-20 MED ORDER — METFORMIN HCL 500 MG PO TABS: 500.0000 mg | ORAL_TABLET | Freq: Every day | ORAL | 0 refills | Status: DC

## 2023-02-20 NOTE — Telephone Encounter (Signed)
Called and spoke to patient's mother.  He has started on Metformin which has caused some loose stool. She was wondering if he needs to stay on the Citrucel once he finished the bottle.  She has stopped using the Miralax and understands to add that when his stool gets too hard.  For now I encouraged her to continue the Citrucel daily. All other questions answered

## 2023-02-20 NOTE — Progress Notes (Signed)
Subjective:  Christopher [name] is a 22 y.o. male who presents for Chief Complaint  Patient presents with   Prediabetes    Per diabetes follow-up, flu and covid shot     Here for chronic issue follow up.  Here with mom and grandmother.  At risk for diabetes, elevated insulin levels-he was formally seeing pediatric endocrinology but he is aged out now.  Last visit with the help of coordination from his prior pediatric endocrinologist we added metformin.  Compliant with metformin 500 mg once daily.  We discussed possibly looking at GLP-1 medication to help with weight loss and sugar control.  Vitamin D deficiency-compliant with vitamin D 50,000 weekly.  He has a lab that was showing low vitamin D 4 months ago.  He has recent lab a month ago showed improvements into the normal range but still on the low end of normal.  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  On metformin the past month his stools are not as hard and this may be helping.  Not really using miralax.   Mom notes that he had a bump on back of head, pustule, but wants this checked.    No other aggravating or relieving factors.    No other c/o.  Past Medical History:  Diagnosis Date   Allergy    Anxiety, generalized    Autism    Diagnosed at 35 months of age when mom noted he was not developing the same as his cousin   Depression    Hyperinsulinemia    eval at [facility] prior to 2024   OCD (obsessive compulsive disorder)    Tall stature    Vitamin D deficiency    Current Outpatient Medications on File Prior to Visit  Medication Sig Dispense Refill   cetirizine (ZYRTEC) 10 MG tablet Take 10 mg by mouth as needed.     fluticasone (FLONASE) 50 MCG/ACT nasal spray Place 1 spray into both nostrils as needed.     hydrOXYzine (ATARAX) 50 MG tablet Take 50 mg by mouth 4 (four) times daily as needed.     methylcellulose (CITRUCEL) oral  powder Take 1 packet by mouth daily.     Vitamin D, Ergocalciferol, (DRISDOL) 1.25 MG (50000 UNIT) CAPS capsule Take 1 capsule (50,000 Units total) by mouth every 7 (seven) days. 12 capsule 3   diltiazem 2 % GEL Diltiazem gel with lidocaine: use a pea size amount per rectum three times a day until symptoms   resolve (Patient not taking: Reported on 02/20/2023) 30 g 1   No current facility-administered medications on file prior to visit.    The following portions of the patient's history were reviewed and updated as appropriate: allergies, current medications, past family history, past medical history, past social history, past surgical history and problem list.  ROS Otherwise as in subjective above    Objective: BP 120/80   Pulse 89   Wt 282 lb 3.2 oz (128 kg)   BMI 34.81 kg/m   Wt Readings from Last 3 Encounters:  02/20/23 282 lb 3.2 oz (128 kg)  01/23/23 287 lb 9.6 oz (130.5 kg)  12/11/22 284 lb 6 oz (129 kg)    General appearance: alert, no distress, well developed, well nourished Skin lesion: Right posterior scalp within the hairline with a small 4 mm x 3 mm brown raised benign-appearing papular lesion, likely seborrheic keratosis  Assessment: Encounter Diagnoses  Name Primary?   Hyperinsulinism Yes   COVID-19 vaccine administered    Needs flu shot    Acanthosis nigricans    BMI 34.0-34.9,adult    Vitamin D deficiency    Impaired fasting blood sugar      Plan: Hyperinsulinemia-prior management by pediatric endocrinology.  We have made a referral to adult endocrinology, appointment coming up.  Last visit blood sugars are at risk for diabetes.  We initiated metformin.  So far doing okay on this and actually stools are doing better on metformin.  We discussed possibly a GLP-1 medication instead of metformin which could potentially help weight loss, blood sugar control and hidradenitis.  They will discuss with endocrinology coming up.  Vitamin D deficiency-reviewed  recent labs with some improvement.  Continue vitamin D supplement  Counseled on the influenza virus vaccine.  Vaccine information sheet given.  Influenza vaccine given after consent obtained.  Counseled on the Covid virus vaccine.  Vaccine information sheet given.  Covid vaccine given after consent obtained.   [name] was seen today for prediabetes.  Diagnoses and all orders for this visit:  Hyperinsulinism  COVID-19 vaccine administered -     Pfizer Comirnaty Covid -19 Vaccine 87yrs and older  Needs flu shot -     Flu vaccine trivalent PF, 6mos and older(Flulaval,Afluria,Fluarix,Fluzone)  Acanthosis nigricans  BMI 34.0-34.9,adult  Vitamin D deficiency  Impaired fasting blood sugar  Other orders -     metFORMIN (GLUCOPHAGE) 500 MG tablet; Take 1 tablet (500 mg total) by mouth daily with breakfast.   Follow up: with new consult with adult endocrinology

## 2023-02-20 NOTE — Patient Instructions (Signed)
[facility] [address] [place], [place] [address] [phone]

## 2023-02-20 NOTE — Telephone Encounter (Signed)
Inbound call from patient's mother, is wanting to speak with someone in regards to patient's medicine prescribed by Dr. Adela Lank on his last visit and if he were to need a refill again. Please advise.

## 2023-04-23 ENCOUNTER — Ambulatory Visit (INDEPENDENT_AMBULATORY_CARE_PROVIDER_SITE_OTHER): Payer: 59 | Admitting: Physician Assistant

## 2023-04-23 ENCOUNTER — Encounter: Payer: Self-pay | Admitting: Physician Assistant

## 2023-04-23 VITALS — BP 116/80 | HR 95 | Ht 75.5 in | Wt 284.2 lb

## 2023-04-23 DIAGNOSIS — K5900 Constipation, unspecified: Secondary | ICD-10-CM

## 2023-04-23 DIAGNOSIS — K625 Hemorrhage of anus and rectum: Secondary | ICD-10-CM | POA: Diagnosis not present

## 2023-04-23 DIAGNOSIS — K602 Anal fissure, unspecified: Secondary | ICD-10-CM | POA: Diagnosis not present

## 2023-04-23 NOTE — Patient Instructions (Signed)
Start  Citrucel 2 teaspoons in 8 ounces of liquid daily.  Start Miralax 1 capful every other day in 8 ounces of liquid.  You have been scheduled for a colonoscopy. Please follow written instructions given to you at your visit today.   Please pick up your prep supplies at the pharmacy within the next 1-3 days.  If you use inhalers (even only as needed), please bring them with you on the day of your procedure.  DO NOT TAKE 7 DAYS PRIOR TO TEST- Trulicity (dulaglutide) Ozempic, Wegovy (semaglutide) Mounjaro (tirzepatide) Bydureon Bcise (exanatide extended release)  DO NOT TAKE 1 DAY PRIOR TO YOUR TEST Rybelsus (semaglutide) Adlyxin (lixisenatide) Victoza (liraglutide) Byetta (exanatide) __________________________________________________________________  _______________________________________________________  If your blood pressure at your visit was 140/90 or greater, please contact your primary care physician to follow up on this.  _______________________________________________________  If you are age 73 or older, your body mass index should be between 23-30. Your Body mass index is 35.06 kg/m. If this is out of the aforementioned range listed, please consider follow up with your Primary Care Provider.  If you are age 30 or younger, your body mass index should be between 19-25. Your Body mass index is 35.06 kg/m. If this is out of the aformentioned range listed, please consider follow up with your Primary Care Provider.   ________________________________________________________  The [facility] providers would like to encourage you to use [employer] to communicate with providers for non-urgent requests or questions.  Due to long hold times on the telephone, sending your provider a message by [facility] may be a faster and more efficient way to get a response.  Please allow 48 business hours for a response.  Please remember that this is for non-urgent requests.   _______________________________________________________

## 2023-04-24 ENCOUNTER — Encounter: Payer: Self-pay | Admitting: Physician Assistant

## 2023-04-24 NOTE — Progress Notes (Signed)
Agree with assessment and plan as outlined.  

## 2023-04-24 NOTE — Progress Notes (Signed)
Subjective:    Patient ID: Christopher [name], male    DOB: 07/11/2000, 22 y.o.   MRN: [id number]  HPI Christopher [name] is a 22 year old African-American male, recently established with Dr. Adela Lank and seen in the office on 12/11/2022.  Patient has autism, anxiety, OCD, depression and hyperinsulinemia.  He was seen for intermittent bright red blood in his stools, and some issues with constipation.  He really did not complain of any significant rectal pain but on exam during that visit was found to have a posterior midline anal fissure, and hard stool in vault.  Plan was to treat the fissure, and start him on MiraLAX and Citrucel.  Colonoscopy was to be considered pending his course and was discussed at that time.  Patient is accompanied by his grandmother today whom he also lives with and she offers most of the history.  He apparently finished all of the diltiazem/lidocaine.  She reports that they are still noticing blood intermittently in his stools which is always bright red and does not appear to be mixed with the bowel movements.  His grandmother says they do not see every bowel movement but ask him to let them see his stools periodically throughout the week.  His grandmother reports that he is not having a bowel movement every day , but generally has at least 2-3 bowel movements per week.  They are currently not using the Citrucel or MiraLAX on an every day basis, but rather as needed.  When asked he is not complaining of any significant rectal pain or discomfort says he has" some" off and on. He has no complaints of abdominal pain.   Review of Systems. Pertinent positive and negative review of systems were noted in the above HPI section.  All other review of systems was otherwise negative.   Outpatient Encounter Medications as of 04/23/2023  Medication Sig   cetirizine (ZYRTEC) 10 MG tablet Take 10 mg by mouth as needed.   fluticasone (FLONASE) 50 MCG/ACT nasal spray Place 1 spray into both nostrils as  needed.   metFORMIN (GLUCOPHAGE) 500 MG tablet Take 1 tablet (500 mg total) by mouth daily with breakfast.   methylcellulose (CITRUCEL) oral powder Take 1 packet by mouth daily.   propranolol (INDERAL) 10 MG tablet Take 5 mg by mouth 2 (two) times daily.   sertraline (ZOLOFT) 50 MG tablet Take 25 mg by mouth daily.   Vitamin D, Ergocalciferol, (DRISDOL) 1.25 MG (50000 UNIT) CAPS capsule Take 1 capsule (50,000 Units total) by mouth every 7 (seven) days.   diltiazem 2 % GEL Diltiazem gel with lidocaine: use a pea size amount per rectum three times a day until symptoms   resolve (Patient not taking: Reported on 02/20/2023)   [DISCONTINUED] hydrOXYzine (ATARAX) 50 MG tablet Take 50 mg by mouth 4 (four) times daily as needed.   No facility-administered encounter medications on file as of 04/23/2023.   Allergies  Allergen Reactions   Abilify [Aripiprazole] Other (See Comments)    Caused aggression and Per "[employer]ight" pharmacogenomic testing, this is deemed to have a SIGNIFICANT gene-drug interaction   Anafranil [Clomipramine] Other (See Comments)    Per "[employer]" pharmacogenomic testing, this is deemed to have SIGNIFICANT gene-drug interaction   Celexa [Citalopram] Other (See Comments)    Per "[employer]esight" pharmacogenomic testing, this is deemed to have SIGNIFICANT gene-drug interaction   Clozaril [Clozapine] Other (See Comments)    Per "[employer]es[employer]" pharmacogenomic testing, this is deemed to have MODERATE gene-drug interaction   Concerta [Methylphenidate] Other (See Comments)  Per "[employer]esight" pharmacogenomic testing, this is deemed to have MODERATE gene-drug interaction   Cymbalta [Duloxetine Hcl] Other (See Comments)    Per "[employer]esight" pharmacogenomic testing, this is deemed to have MODERATE gene-drug interaction   Desyrel [Trazodone] Other (See Comments)    Per "Genesight" pharmacogenomic testing, this is deemed to have MODERATE gene-drug interaction   Effexor [Venlafaxine] Other (See  Comments)    Per "Genesight" pharmacogenomic testing, this is deemed to have SIGNIFICANT gene-drug interaction   Elavil [Amitriptyline] Other (See Comments)    Per "Genesight" pharmacogenomic testing, this is deemed to have SIGNIFICANT gene-drug interaction   Emsam [Selegiline] Other (See Comments)    Per "Genesight" pharmacogenomic testing, this is deemed to have MODERATE gene-drug interaction   Fanapt [Iloperidone] Other (See Comments)    Per "Genesight" pharmacogenomic testing, this is deemed to have SIGNIFICANT gene-drug interaction   Focalin [Dexmethylphenidate] Other (See Comments)    Per "[employer]esight" pharmacogenomic testing, this is deemed to have MODERATE gene-drug interaction   Haldol [Haloperidol] Other (See Comments)    Per "[employer]esight" pharmacogenomic testing, this is deemed to have MODERATE gene-drug interaction   Inderal [Propranolol] Other (See Comments)    Per "Genesight" pharmacogenomic testing, this is deemed to have MODERATE gene-drug interaction   Lexapro [Escitalopram] Other (See Comments)    Per "Genesight" pharmacogenomic testing, this is deemed to have SIGNIFICANT gene-drug interaction   Luvox [Fluvoxamine] Other (See Comments)    Per "Genesight" pharmacogenomic testing, this is deemed to have MODERATE gene-drug interaction   Mellaril [Thioridazine] Other (See Comments)    Per "Genesight" pharmacogenomic testing, this is deemed to have SIGNIFICANT gene-drug interaction   Navane [Thiothixene (Tiotixene)] Other (See Comments)    Per "Genesight" pharmacogenomic testing, this is deemed to have SIGNIFICANT gene-drug interaction   Norpramin [Desipramine] Other (See Comments)    Per "Genesight" pharmacogenomic testing, this is deemed to have SIGNIFICANT gene-drug interaction   Other Itching and Other (See Comments)    Seasonal allergies- Itchy eyes, runny nose, some wheezing   Pamelor [Nortriptyline] Other (See Comments)    Per "[employer]esight" pharmacogenomic testing, this is  deemed to have SIGNIFICANT gene-drug interaction   Paxil [Paroxetine] Other (See Comments)    Per "[employer]esight" pharmacogenomic testing, this is deemed to have SIGNIFICANT gene-drug interaction   Prolixin [Fluphenazine] Other (See Comments)    Per "Genesight" pharmacogenomic testing, this is deemed to have a moderate gene-drug interaction   Prozac [Fluoxetine] Other (See Comments)    Per "Genesight" pharmacogenomic testing, this is deemed to have SIGNIFICANT gene-drug interaction   Remeron [Mirtazapine] Other (See Comments)    Per "Genesight" pharmacogenomic testing, this is deemed to have MODERATE gene-drug interaction   Rexulti [Brexpiprazole] Other (See Comments)    Per "Genesight" pharmacogenomic testing, this is deemed to have SIGNIFICANT gene-drug interaction   Risperdal [Risperidone] Other (See Comments)    Per "Genesight" pharmacogenomic testing, this is deemed to have SIGNIFICANT gene-drug interaction   Saphris [Asenapine] Other (See Comments)    Per "Genesight" pharmacogenomic testing, this is deemed to have MODERATE gene-drug interaction   Seroquel [Quetiapine] Other (See Comments)    Per "Genesight" pharmacogenomic testing, this is deemed to have MODERATE gene-drug interaction   Sinequan [Doxepin] Other (See Comments)    Per "Genesight" pharmacogenomic testing, this is deemed to have SIGNIFICANT gene-drug interaction   Thorazine [Chlorpromazine] Other (See Comments)    Per "Genesight" pharmacogenomic testing, this is deemed to have MODERATE gene-drug interaction   Tofranil [Imipramine] Other (See Comments)    Per "Genesight" pharmacogenomic testing,  this is deemed to have SIGNIFICANT gene-drug interaction   Trilafon [Perphenazine] Other (See Comments)    Per "Genesight" pharmacogenomic testing, this is deemed to have MODERATE gene-drug interaction   Trintellix [Vortioxetine] Other (See Comments)    Per "[employer]esight" pharmacogenomic testing, this is deemed to have SIGNIFICANT  gene-drug interaction   Valium [Diazepam] Other (See Comments)    Per "Genesight" pharmacogenomic testing, this is deemed to have MODERATE gene-drug interaction   Viibryd [Vilazodone Hcl] Other (See Comments)    Per "[employer]esight" pharmacogenomic testing, this is deemed to have MODERATE gene-drug interaction   Wellbutrin [Bupropion] Other (See Comments)    Per "Genesight" pharmacogenomic testing, this is deemed to have SIGNIFICANT gene-drug interaction   Zoloft [Sertraline] Other (See Comments)    Per "Genesight" pharmacogenomic testing, this is deemed to have MODERATE gene-drug interaction   Zyprexa [Olanzapine] Other (See Comments)    Per "[employer]" pharmacogenomic testing, this is deemed to have MODERATE gene-drug interaction   Patient Active Problem List   Diagnosis Date Noted   Impaired fasting blood sugar 02/20/2023   Vitamin D deficiency 02/20/2023   BMI 34.0-34.9,adult 02/20/2023   Autism spectrum    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   Hyperinsulinism 03/27/2017   Tall stature 03/27/2017   Acanthosis nigricans 03/27/2017   Obesity without serious comorbidity with body mass index (BMI) in 95th to 98th percentile for age in pediatric patient 03/27/2017   Auditory processing disorder 01/03/2016   Developmental delay 01/03/2016   Social History   Socioeconomic History   Marital status: Single    Spouse name: Not on file   Number of children: 0   Years of education: Not on file   Highest education level: Not on file  Occupational History   Occupation: Consulting civil engineer  Tobacco Use   Smoking status: Never   Smokeless tobacco: Never  Vaping Use   Vaping status: Never Used  Substance and Sexual Activity   Alcohol use: Never   Drug use: Never   Sexual activity: Never  Other Topics Concern   Not on file  Social History Narrative   Lives with MGM, mom and younger brother.   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   Has phobia about insects and bugs.   High anxiety about school.  formerly [employer].   09/2022.     Social Determinants of Health   Financial Resource Strain: Not on file  Food Insecurity: Not on file  Transportation Needs: Not on file  Physical Activity: Not on file  Stress: Not on file  Social Connections: Not on file  Intimate Partner Violence: Not on file    Christopher [name] family history includes Atrial fibrillation in his maternal grandmother; Cancer in his maternal great-grandmother; Diabetes in his maternal grandfather; Hypertension in his maternal grandfather, maternal grandmother, and mother; Hypothyroidism in his maternal grandmother; Insulin resistance in his mother; Kidney cancer in his father; Kidney disease in his cousin and father; Kidney failure in his maternal grandfather; Pancreatitis in his father.      Objective:    Vitals:   04/23/23 1500  BP: 116/80  Pulse: 95  SpO2: 98%    Physical Exam Well-developed well-nourished young AA male  in no acute distress.  Accompanied by his grandmother who gives much of the history height, Weight, 284 BMI 35.0  HEENT; nontraumatic normocephalic, EOMI, PE R LA, sclera anicteric.  Skin; benign exam, no jaundice rash or appreciable lesions Extremities;  no clubbing cyanosis or edema skin warm and dry Neuro/Psych; alert and oriented x4, grossly nonfocal mood and affect appropriate   Patient declined rectal exam today       Assessment & Plan:   #20 22 year old African-American male with intermittent bright red blood per rectum over the past year, and associated mild constipation. Found to have a posterior anal fissure on exam per Dr. Adela Lank July 2024.  That has been treated  Patient is not a good historian, history obtained today by his grandmother.  Patient is still having intermittent bright red blood, not having bowel movements every day but generally at least 2-3 times per week.  He has not been  complaining of rectal pain  Etiology of rectal bleeding may be secondary to persistent anal fissure, partially healed fissure, cannot rule out other underlying colorectal pathology, IBD etc.  #2 autism #3.  Anxiety/depression #4.  OCD #5.  Hyperinsulinemia  Plan; Restart Citrucel 1 dose daily in a glass of water or juice every single day. Advised use of MiraLAX at least every other day. Patient will be scheduled for colonoscopy with Dr. Adela Lank.  Procedure was discussed in detail with the patient and his grandmother and they are agreeable to proceed. Advised he take a dose of MiraLAX every single day for 1 week prior to starting bowel prep, then gave a MiraLAX Gatorade prep to assure that he would be agreeable to drink it.  Family will assist with bowel prep. Other recommendations pending findings of above  Sammuel Cooper PA-C 04/24/2023   Cc: Christopher Canavan, PA-C

## 2023-05-10 ENCOUNTER — Ambulatory Visit: Payer: 59 | Admitting: Nurse Practitioner

## 2023-05-10 ENCOUNTER — Encounter: Payer: Self-pay | Admitting: Nurse Practitioner

## 2023-05-10 VITALS — BP 124/82 | HR 88 | Temp 99.3°F | Wt 293.4 lb

## 2023-05-10 DIAGNOSIS — J029 Acute pharyngitis, unspecified: Secondary | ICD-10-CM | POA: Diagnosis not present

## 2023-05-10 DIAGNOSIS — R0789 Other chest pain: Secondary | ICD-10-CM | POA: Diagnosis not present

## 2023-05-10 DIAGNOSIS — K5909 Other constipation: Secondary | ICD-10-CM

## 2023-05-10 LAB — POCT RAPID STREP A (OFFICE): Rapid Strep A Screen: NEGATIVE

## 2023-05-10 MED ORDER — MELOXICAM 15 MG PO TABS
15.0000 mg | ORAL_TABLET | Freq: Every day | ORAL | 2 refills | Status: DC
Start: 2023-05-10 — End: 2024-01-31

## 2023-05-10 MED ORDER — LEVOCETIRIZINE DIHYDROCHLORIDE 5 MG PO TABS: 5.0000 mg | ORAL_TABLET | Freq: Every day | ORAL | 3 refills | Status: DC

## 2023-05-10 NOTE — Progress Notes (Unsigned)
  Tollie Eth, DNP, AGNP-c [facility] [address] [place], [place] [address] [phone]   ACUTE VISIT- ESTABLISHED PATIENT  Blood pressure 124/82, pulse 88, temperature 99.3 F (37.4 C), weight 293 lb 6.4 oz (133.1 kg), SpO2 96%.  Subjective:  HPI Christopher [name] is a 21 y.o. male presents to day for evaluation of acute concern(s).   .hpisecconsentabridge .apsecabridge .pesec  ROS negative except for what is listed in HPI. History, Medications, Surgery, SDOH, and Family History reviewed and updated as appropriate.  Objective:  Physical Exam {Physical Exam:31112}      Assessment & Plan:   Problem List Items Addressed This Visit   None Visit Diagnoses       Sore throat    -  Primary   Relevant Orders   Rapid Strep A         Tollie Eth, DNP, AGNP-c

## 2023-05-10 NOTE — Patient Instructions (Signed)
VISIT SUMMARY:  During today's visit, we addressed your chest and back pain, sore throat, and constipation. We also reviewed your current medications and discussed some general health maintenance tips.  YOUR PLAN:  -COSTOCHONDRITIS: Costochondritis is inflammation of the cartilage in the ribcage, causing chest and back pain. We suspect this is due to your recent heavy lifting. You will take meloxicam once daily for two weeks and then as needed.  -POST-NASAL DRIP: Post-nasal drip occurs when excess mucus from the nose drains into the throat, causing a sore throat. This may be due to allergies or weather changes. You will take levocetirizine at bedtime for at least one week, then as needed.  -CHRONIC CONSTIPATION: Chronic constipation is infrequent bowel movements or difficulty passing stools. You have been experiencing this along with occasional blood in your stool. Continue using Citrucel or Miralax as needed and proceed with your scheduled colonoscopy next month to investigate further.  -GENERAL HEALTH MAINTENANCE: We reviewed your current medications, including metformin, Zoloft, and anxiety medications. We also discussed the impact of carbonated sodas on your gastrointestinal symptoms. Please continue your prescribed medications and try to moderate your intake of carbonated sodas.  INSTRUCTIONS:  Please follow up with your scheduled colonoscopy next month to investigate the cause of blood in your stool. Continue taking your prescribed medications and follow the new medication instructions provided today.

## 2023-05-20 ENCOUNTER — Other Ambulatory Visit: Payer: Self-pay | Admitting: Medical

## 2023-05-21 DIAGNOSIS — K5909 Other constipation: Secondary | ICD-10-CM | POA: Insufficient documentation

## 2023-05-21 NOTE — Assessment & Plan Note (Signed)
 Suspected due to localized chest tenderness upon palpation, likely secondary to inflammation of the ribcage cartilage. Symptoms include chest and back pain, exacerbated by physical activity. No signs of infection or respiratory distress. Negative strep test. Explained typical resolution with anti-inflammatory treatment. - Prescribe meloxicam , once daily for two weeks - Recommend ibuprofen for inflammation and pain management

## 2023-05-21 NOTE — Assessment & Plan Note (Signed)
 Chronic constipation with intermittent blood in stool. Scheduled for a colonoscopy next month. Currently managed with Citrucel or Miralax  as needed. Discussed the importance of the colonoscopy to investigate the cause of blood in stool. - Continue current management with Citrucel or Miralax  - Proceed with scheduled colonoscopy

## 2023-05-21 NOTE — Assessment & Plan Note (Signed)
 Suspected due to sinus drainage observed in the throat, causing sore throat. No sinus tenderness or significant cough. Likely exacerbated by recent weather changes and possible allergies. Explained that levocetirizine can help manage symptoms and should be taken at bedtime due to its sedative effects. - Prescribe levocetirizine, to be taken at bedtime for at least one week, then as needed

## 2023-05-22 ENCOUNTER — Other Ambulatory Visit: Payer: Self-pay | Admitting: Medical

## 2023-05-27 ENCOUNTER — Encounter: Payer: Self-pay | Admitting: Gastroenterology

## 2023-06-03 ENCOUNTER — Encounter: Payer: Self-pay | Admitting: Gastroenterology

## 2023-06-03 ENCOUNTER — Ambulatory Visit: Payer: 59 | Admitting: Gastroenterology

## 2023-06-03 VITALS — BP 125/82 | HR 80 | Temp 99.3°F | Resp 14 | Ht 75.5 in | Wt 284.0 lb

## 2023-06-03 DIAGNOSIS — K625 Hemorrhage of anus and rectum: Secondary | ICD-10-CM | POA: Diagnosis not present

## 2023-06-03 DIAGNOSIS — K648 Other hemorrhoids: Secondary | ICD-10-CM | POA: Diagnosis not present

## 2023-06-03 DIAGNOSIS — K6289 Other specified diseases of anus and rectum: Secondary | ICD-10-CM | POA: Diagnosis not present

## 2023-06-03 DIAGNOSIS — K602 Anal fissure, unspecified: Secondary | ICD-10-CM

## 2023-06-03 MED ORDER — AMBULATORY NON FORMULARY MEDICATION: 2 refills | Status: AC

## 2023-06-03 MED ORDER — SODIUM CHLORIDE 0.9 % IV SOLN: 500.0000 mL | INTRAVENOUS | Status: DC

## 2023-06-03 NOTE — Op Note (Signed)
 [facility] Patient Name: Christopher [name] Procedure Date: 06/03/2023 2:05 PM MRN: [id number] Endoscopist: Elspeth P. Leigh , MD, [id number] Age: 23 Referring MD:  Date of Birth: 2000/09/28 Gender: Male Account #: [id number] Procedure:                Colonoscopy Indications:              Rectal bleeding - reported history of anal fissure,                            history of constipation Medicines:                Monitored Anesthesia Care Procedure:                Pre-Anesthesia Assessment:                           - Prior to the procedure, a History and Physical                            was performed, and patient medications and                            allergies were reviewed. The patient's tolerance of                            previous anesthesia was also reviewed. The risks                            and benefits of the procedure and the sedation                            options and risks were discussed with the patient.                            All questions were answered, and informed consent                            was obtained. Prior Anticoagulants: The patient has                            taken no anticoagulant or antiplatelet agents. ASA                            Grade Assessment: II - A patient with mild systemic                            disease. After reviewing the risks and benefits,                            the patient was deemed in satisfactory condition to                            undergo the procedure.  After obtaining informed consent, the colonoscope                            was passed under direct vision. Throughout the                            procedure, the patient's blood pressure, pulse, and                            oxygen saturations were monitored continuously. The                            CF [id number] #[id number] was introduced through the anus                            and advanced to the the  terminal ileum, with                            identification of the appendiceal orifice and IC                            valve. The colonoscopy was performed without                            difficulty. The patient tolerated the procedure                            well. The quality of the bowel preparation was                            adequate. The terminal ileum, ileocecal valve,                            appendiceal orifice, and rectum were photographed. Scope In: 2:11:58 PM Scope Out: 2:27:47 PM Scope Withdrawal Time: 0 hours 11 minutes 52 seconds  Total Procedure Duration: 0 hours 15 minutes 49 seconds  Findings:                 A moderate sized posterior midline anal fissure was                            found on perianal exam.                           The terminal ileum appeared normal.                           Anal papilla(e) were hypertrophied.                           Internal hemorrhoids were found during                            retroflexion. The hemorrhoids were small.  The exam was otherwise without abnormality. Complications:            No immediate complications. Estimated blood loss:                            None. Estimated Blood Loss:     Estimated blood loss: none. Impression:               - Anal fissure found on perianal exam.                           - The examined portion of the ileum was normal.                           - Anal papilla(e) were hypertrophied.                           - Internal hemorrhoids.                           - The examination was otherwise normal.                           - No specimens collected.                           Symptoms of intermittent bleeding are very likely                            due to anal fissure in the setting of constipation. Recommendation:           - Patient has a contact number available for                            emergencies. The signs and symptoms of potential                             delayed complications were discussed with the                            patient. Return to normal activities tomorrow.                            Written discharge instructions were provided to the                            patient.                           - Resume previous diet.                           - Continue present medications.                           - Continue bowel regimen of Citrucel / Miralax   daily to keep stools soft, prevent constipation.                            Increase Miralax  PRN                           - Use diltiazem  / lidocaine gel applied PR TID for                            4 weeks or topical nitroglycerin, will discuss                            options with them Elspeth P. Lezly Rumpf, MD 06/03/2023 2:34:14 PM This report has been signed electronically.

## 2023-06-03 NOTE — Progress Notes (Signed)
 Sedate, gd SR, tolerated procedure well, VSS, report to RN

## 2023-06-03 NOTE — Patient Instructions (Addendum)
 Resume previous diet Continue present medications, including citrucel and miralax  to keep stools soft Pick up NTG ointment 0.125% at [facility], apply pea sized amt first knuckle into the rectum as directed  Handouts/information given for nitroglycerin rectal ointment, anal fissure, diverticulosis and hemorrhoids  YOU HAD AN ENDOSCOPIC PROCEDURE TODAY AT THE [facility]:   Refer to the procedure report that was given to you for any specific questions about what was found during the examination.  If the procedure report does not answer your questions, please call your gastroenterologist to clarify.  If you requested that your care partner not be given the details of your procedure findings, then the procedure report has been included in a sealed envelope for you to review at your convenience later.  YOU SHOULD EXPECT: Some feelings of bloating in the abdomen. Passage of more gas than usual.  Walking can help get rid of the air that was put into your GI tract during the procedure and reduce the bloating. If you had a lower endoscopy (such as a colonoscopy or flexible sigmoidoscopy) you may notice spotting of blood in your stool or on the toilet paper. If you underwent a bowel prep for your procedure, you may not have a normal bowel movement for a few days.  Please Note:  You might notice some irritation and congestion in your nose or some drainage.  This is from the oxygen used during your procedure.  There is no need for concern and it should clear up in a day or so.  SYMPTOMS TO REPORT IMMEDIATELY:  Following lower endoscopy (colonoscopy):  Excessive amounts of blood in the stool  Significant tenderness or worsening of abdominal pains  Swelling of the abdomen that is new, acute  Fever of 100F or higher  For urgent or emergent issues, a gastroenterologist can be reached at any hour by calling [phone]. Do not use MyChart messaging for urgent concerns.   DIET:  We do  recommend a small meal at first, but then you may proceed to your regular diet.  Drink plenty of fluids but you should avoid alcoholic beverages for 24 hours.  ACTIVITY:  You should plan to take it easy for the rest of today and you should NOT DRIVE or use heavy machinery until tomorrow (because of the sedation medicines used during the test).    FOLLOW UP: Our staff will call the number listed on your records the next business day following your procedure.  We will call around 7:15- 8:00 am to check on you and address any questions or concerns that you may have regarding the information given to you following your procedure. If we do not reach you, we will leave a message.     SIGNATURES/CONFIDENTIALITY: You and/or your care partner have signed paperwork which will be entered into your electronic medical record.  These signatures attest to the fact that that the information above on your After Visit Summary has been reviewed and is understood.  Full responsibility of the confidentiality of this discharge information lies with you and/or your care-partner.

## 2023-06-03 NOTE — Progress Notes (Signed)
 [facility] History and Physical   Primary Care Physician:  Bulah Alm RAMAN, PA-C   Reason for Procedure:   Rectal bleeding  Plan:    colonoscopy     HPI: Franklin Baumbach is a 23 y.o. male  here for colonoscopy to evaluate symptoms of bleeding. History of constipation and periodica rectal bleeding over the past year.  History of anal fissure treated, but symptoms persist over time.     No family history of colon cancer known. Otherwise feels well without any cardiopulmonary symptoms.   I have discussed risks / benefits of anesthesia and endoscopic procedure with Penne Eagles and they wish to proceed with the exams as outlined today. Mother at bedside who also agrees with the plan.   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 Authorizing Provider  metFORMIN  (GLUCOPHAGE ) 500 MG tablet TAKE 1 TABLET(500 MG) BY MOUTH DAILY WITH BREAKFAST 05/20/23  Yes Tysinger, Alm RAMAN, PA-C  methylcellulose (CITRUCEL) oral powder Take 1 packet by mouth daily. 12/11/22  Yes Quang Thorpe, Elspeth SQUIBB, MD  polyethylene glycol powder (GLYCOLAX /MIRALAX ) 17 GM/SCOOP powder Take 17 g by mouth daily. 05/20/23  Yes [provider]  propranolol (INDERAL) 10 MG tablet Take 5 mg by mouth 2 (two) times daily. 03/29/23 10/25/23 Yes [provider]  sertraline  (ZOLOFT ) 50 MG tablet Take 25 mg by mouth daily. 02/28/23 02/28/24 Yes [provider]  Vitamin D , Ergocalciferol , (DRISDOL ) 1.25 MG (50000 UNIT) CAPS capsule TAKE 1 CAPSULE BY MOUTH  EVERY 7 DAYS 05/23/23  Yes Tysinger, Alm RAMAN, PA-C  fluticasone (FLONASE) 50 MCG/ACT nasal spray Place 1 spray into both nostrils as needed. 04/24/22   [provider]  levocetirizine (XYZAL ) 5 MG tablet Take 1 tablet (5 mg total) by mouth at bedtime. Take for 1 week then may be used as needed for allergy symptoms. 05/10/23   Oris Camie BRAVO, NP  meloxicam  (MOBIC ) 15 MG tablet Take 1 tablet (15 mg total) by mouth daily. For 2 weeks then as needed once daily for pain/aching. Patient not taking: Reported on 06/03/2023 05/10/23   Oris Camie BRAVO, NP    Current Outpatient Medications  Medication Sig Dispense Refill   metFORMIN  (GLUCOPHAGE ) 500 MG tablet TAKE 1 TABLET(500 MG) BY MOUTH DAILY WITH BREAKFAST 90 tablet 0   methylcellulose (CITRUCEL) oral powder Take 1 packet by mouth daily.     polyethylene glycol powder (GLYCOLAX /MIRALAX ) 17 GM/SCOOP powder Take 17 g by mouth daily.     propranolol (INDERAL) 10 MG tablet Take 5 mg by mouth 2 (two) times daily.     sertraline  (ZOLOFT ) 50 MG tablet Take 25 mg by mouth daily.     Vitamin D , Ergocalciferol , (DRISDOL ) 1.25 MG (50000 UNIT) CAPS capsule TAKE 1 CAPSULE BY MOUTH EVERY 7 DAYS 12 capsule 0   fluticasone (FLONASE) 50 MCG/ACT nasal spray Place 1 spray into both nostrils as needed.     levocetirizine (XYZAL ) 5 MG  tablet Take 1 tablet (5 mg total) by mouth at bedtime. Take for 1 week then may be used as needed for allergy symptoms. 30 tablet 3   meloxicam  (MOBIC ) 15 MG tablet Take 1 tablet (15 mg total) by mouth daily. For 2 weeks then as needed once daily for pain/aching. (Patient not taking: Reported on 06/03/2023) 30 tablet 2   Current Facility-Administered Medications  Medication Dose Route Frequency Provider Last Rate Last Admin   0.9 %  sodium chloride  infusion  500 mL Intravenous Continuous Dupree Givler, Elspeth SQUIBB, MD        Allergies as of 06/03/2023 - Review Complete 06/03/2023  Allergen Reaction Noted   Abilify  [aripiprazole ] Other (See  Comments) 11/23/2019   Anafranil [clomipramine] Other (See Comments) 11/23/2019   Celexa [citalopram] Other (See Comments) 11/23/2019   Clozaril [clozapine] Other (See Comments) 11/23/2019   Concerta [methylphenidate] Other (See Comments) 11/23/2019   Cymbalta [duloxetine hcl] Other (See Comments) 11/23/2019   Desyrel [trazodone] Other (See Comments) 11/23/2019   Effexor [venlafaxine] Other (See Comments) 11/23/2019   Elavil [amitriptyline] Other (See Comments) 11/23/2019   Emsam [selegiline] Other (See Comments) 11/23/2019   Fanapt [iloperidone] Other (See Comments) 11/23/2019   Focalin [dexmethylphenidate] Other (See Comments) 11/23/2019   Haldol [haloperidol] Other (See Comments) 11/23/2019   Inderal [propranolol] Other (See Comments) 11/23/2019   Lexapro [escitalopram] Other (See Comments) 11/23/2019   Luvox [fluvoxamine] Other (See Comments) 11/23/2019   Mellaril [thioridazine] Other (See Comments) 11/23/2019   Navane [thiothixene (tiotixene)] Other (See Comments) 11/23/2019   Norpramin [desipramine] Other (See Comments) 11/23/2019   Other Itching and Other (See Comments) 11/23/2019   Pamelor [nortriptyline] Other (See Comments) 11/23/2019   Paxil [paroxetine] Other (See Comments) 11/23/2019   Prolixin [fluphenazine] Other (See Comments) 11/23/2019   Prozac  [fluoxetine ] Other (See Comments) 11/23/2019   Remeron [mirtazapine] Other (See Comments) 11/23/2019   Rexulti [brexpiprazole] Other (See Comments) 11/23/2019   Risperdal [risperidone] Other (See Comments) 11/23/2019   Saphris [asenapine] Other (See Comments) 11/23/2019   Seroquel [quetiapine] Other (See Comments) 11/23/2019   Sinequan [doxepin] Other (See Comments) 11/23/2019   Thorazine [chlorpromazine] Other (See Comments) 11/23/2019   Tofranil [imipramine] Other (See Comments) 11/23/2019   Trilafon [perphenazine] Other (See Comments) 11/23/2019   Trintellix [vortioxetine] Other (See Comments) 11/23/2019   Valium  [diazepam] Other (See Comments) 11/23/2019   Viibryd [vilazodone hcl] Other (See Comments) 11/23/2019   Wellbutrin [bupropion] Other (See Comments) 11/23/2019   Zoloft  [sertraline ] Other (See Comments) 11/23/2019   Zyprexa [olanzapine] Other (See Comments) 11/23/2019    Family History  Problem Relation Age of Onset   Hypertension Mother    Insulin  resistance Mother    Kidney cancer Father    Kidney disease Father    Pancreatitis Father    Hypertension Maternal Grandmother    Hypothyroidism Maternal Grandmother    Atrial fibrillation Maternal Grandmother    Hypertension Maternal Grandfather    Diabetes Maternal Grandfather    Kidney failure Maternal Grandfather    Kidney disease Cousin    Cancer Maternal Great-grandmother        ovarian cancer    Social History   Socioeconomic History   Marital status: Single    Spouse name: Not on file   Number of children: 0   Years of education: Not on file   Highest education level: Not on file  Occupational History   Occupation: Student  Tobacco Use   Smoking status: Never   Smokeless tobacco: Never  Vaping Use   Vaping status: Never  Used  Substance and Sexual Activity   Alcohol use: Never   Drug use: Never   Sexual activity: Never  Other Topics Concern   Not on file  Social History Narrative   Lives with MGM, mom and younger brother.   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  Has phobia about insects and bugs.   High anxiety about school.  formerly [employer].   09/2022.     Social Drivers of Corporate Investment Banker Strain: Not on file  Food Insecurity: Not on file  Transportation Needs: Not on file  Physical Activity: Not on file  Stress: Not on file  Social Connections: Not on file  Intimate Partner Violence: Not on file    Review of Systems: All other review of systems negative except as mentioned in the HPI.  Physical Exam: Vital signs BP 122/60 (Cuff Size:  Normal)   Pulse 97   Temp 99.3 F (37.4 C)   Ht 6' 3.5 (1.918 m)   Wt 284 lb (128.8 kg)   BMI 35.03 kg/m   General:   Alert,  Well-developed, pleasant and cooperative in NAD Lungs:  Clear throughout to auscultation.   Heart:  Regular rate and rhythm Abdomen:  Soft, nontender and nondistended.   Neuro/Psych:  Alert and cooperative. Normal mood and affect. A and O x 3  Marcey Naval, MD [facility]

## 2023-06-03 NOTE — Progress Notes (Signed)
 Nitroglycerin gel 0.125 %, TID PR, 30 gram, 2 refills called into [facility]

## 2023-06-04 ENCOUNTER — Telehealth: Payer: Self-pay

## 2023-06-04 NOTE — Telephone Encounter (Signed)
  Follow up Call-     06/03/2023    1:17 PM  Call back number  Post procedure Call Back phone  # [phone]  Permission to leave phone message Yes     Patient questions:  Do you have a fever, pain , or abdominal swelling? No. Pain Score  0 *  Have you tolerated food without any problems? Yes.    Have you been able to return to your normal activities? Yes.    Do you have any questions about your discharge instructions: Diet   No. Medications  No. Follow up visit  No.  Do you have questions or concerns about your Care? No.  Actions: * If pain score is 4 or above: No action needed, pain <4.

## 2023-06-18 ENCOUNTER — Other Ambulatory Visit: Payer: Self-pay

## 2023-06-18 ENCOUNTER — Other Ambulatory Visit: Payer: 59

## 2023-06-18 DIAGNOSIS — E161 Other hypoglycemia: Secondary | ICD-10-CM

## 2023-06-19 LAB — HEMOGLOBIN A1C
Hgb A1c MFr Bld: 5.6 %{Hb} (ref <5.7)
Mean Plasma Glucose: 114 mg/dL
eAG (mmol/L): 6.3 mmol/L

## 2023-06-19 LAB — COMPREHENSIVE METABOLIC PANEL
AG Ratio: 1.7 (calc) (ref 1.0–2.5)
ALT: 27 U/L (ref 9–46)
AST: 21 U/L (ref 10–40)
Albumin: 4.5 g/dL (ref 3.6–5.1)
Alkaline phosphatase (APISO): 47 U/L (ref 36–130)
BUN: 8 mg/dL (ref 7–25)
CO2: 27 mmol/L (ref 20–32)
Calcium: 9.5 mg/dL (ref 8.6–10.3)
Chloride: 104 mmol/L (ref 98–110)
Creat: 0.85 mg/dL (ref 0.60–1.24)
Globulin: 2.7 g/dL (ref 1.9–3.7)
Glucose, Bld: 100 mg/dL — ABNORMAL HIGH (ref 65–99)
Potassium: 4.3 mmol/L (ref 3.5–5.3)
Sodium: 139 mmol/L (ref 135–146)
Total Bilirubin: 0.4 mg/dL (ref 0.2–1.2)
Total Protein: 7.2 g/dL (ref 6.1–8.1)

## 2023-06-19 LAB — LIPID PANEL
Cholesterol: 146 mg/dL (ref <200)
HDL: 30 mg/dL — ABNORMAL LOW (ref >=40)
LDL Cholesterol (Calc): 92 mg/dL
Non-HDL Cholesterol (Calc): 116 mg/dL (ref <130)
Total CHOL/HDL Ratio: 4.9 (calc) (ref <5.0)
Triglycerides: 139 mg/dL (ref <150)

## 2023-06-19 LAB — MICROALBUMIN / CREATININE URINE RATIO
Creatinine, Urine: 290 mg/dL (ref 20–320)
Microalb Creat Ratio: 2 mg/g{creat} (ref <30)
Microalb, Ur: 0.5 mg/dL

## 2023-06-19 LAB — C-PEPTIDE: C-Peptide: 12.3 ng/mL — ABNORMAL HIGH (ref 0.80–3.85)

## 2023-06-20 ENCOUNTER — Other Ambulatory Visit: Payer: 59

## 2023-06-26 ENCOUNTER — Encounter: Payer: Self-pay | Admitting: "Endocrinology

## 2023-06-26 ENCOUNTER — Ambulatory Visit (INDEPENDENT_AMBULATORY_CARE_PROVIDER_SITE_OTHER): Payer: 59 | Admitting: "Endocrinology

## 2023-06-26 VITALS — BP 118/62 | HR 84 | Resp 18 | Ht 75.5 in | Wt 298.2 lb

## 2023-06-26 DIAGNOSIS — F840 Autistic disorder: Secondary | ICD-10-CM | POA: Diagnosis not present

## 2023-06-26 DIAGNOSIS — E66812 Obesity, class 2: Secondary | ICD-10-CM | POA: Diagnosis not present

## 2023-06-26 DIAGNOSIS — Z6836 Body mass index (BMI) 36.0-36.9, adult: Secondary | ICD-10-CM

## 2023-06-26 DIAGNOSIS — R7303 Prediabetes: Secondary | ICD-10-CM | POA: Diagnosis not present

## 2023-06-26 MED ORDER — METFORMIN HCL 500 MG PO TABS: 1000.0000 mg | ORAL_TABLET | Freq: Two times a day (BID) | ORAL | 3 refills | Status: DC

## 2023-06-26 NOTE — Progress Notes (Signed)
 Outpatient Endocrinology Note Christopher Birmingham, MD  06/26/23   Christopher [name] [birth year] [id number]  Referring Provider: Bulah Alm RAMAN, PA-C Primary Care Provider: Bulah Alm RAMAN, PA-C Reason for consultation: Subjective   Assessment & Plan  Diagnoses and all orders for this visit:  Prediabetes  Class 2 severe obesity due to excess calories with serious comorbidity and body mass index (BMI) of 36.0 to 36.9 in adult [facility])  Autism  Other orders -     metFORMIN  (GLUCOPHAGE ) 500 MG tablet; Take 2 tablets (1,000 mg total) by mouth 2 (two) times daily with a meal.   History of Pre-diabetes, No results found for: GFR A1C 5.7-5.8 in 2024, now improved to 5.6  Hba1c goal less than 7, current Hba1c is  Lab Results  Component Value Date   HGBA1C 5.6 06/18/2023   Will recommend the following: Continue Metformin  500 gm qam with break fast  Week 1: one tablet after breakfast and one after dinner  Week 2: one tablet after breakfast and two after dinner  Week 3 and onwards: two tablets after breakfast and two after dinner  Mother is concerned about his weight Patient is in day-care, in verbal but has very limited eye contact/communication   No known contraindications/side effects to any of above medications  -Last LD and Tg are as follows: Lab Results  Component Value Date   LDLCALC 92 06/18/2023    Lab Results  Component Value Date   TRIG 139 06/18/2023   -not on statin  -Follow low fat diet and exercise   -Blood pressure goal <140/90 - Microalbumin/creatinine goal is < 30 -Last MA/Cr is as follows: Lab Results  Component Value Date   MICROALBUR 0.5 06/19/2023   -not on ACE/ARB -diet changes including salt restriction -limit eating outside -counseled BP targets per standards of diabetes care -uncontrolled blood pressure can lead to retinopathy, nephropathy and cardiovascular and atherosclerotic heart disease  Reviewed and counseled on: -A1C  target -Blood sugar targets -Complications of uncontrolled diabetes  -Checking blood sugar before meals and bedtime and bring log next visit -All medications with mechanism of action and side effects -Hypoglycemia management: rule of 15's, Glucagon Emergency Kit and medical alert ID -low-carb low-fat plate-method diet -At least 20 minutes of physical activity per day -Annual dilated retinal eye exam and foot exam -compliance and follow up needs -follow up as scheduled or earlier if problem gets worse  Call if blood sugar is less than 70 or consistently above 250    Take a 15 gm snack of carbohydrate at bedtime before you go to sleep if your blood sugar is less than 100.    If you are going to fast after midnight for a test or procedure, ask your physician for instructions on how to reduce/decrease your insulin  dose.    Call if blood sugar is less than 70 or consistently above 250  -Treating a low sugar by rule of 15  (15 gms of sugar every 15 min until sugar is more than 70) If you feel your sugar is low, test your sugar to be sure If your sugar is low (less than 70), then take 15 grams of a fast acting Carbohydrate (3-4 glucose tablets or glucose gel or 4 ounces of juice or regular soda) Recheck your sugar 15 min after treating low to make sure it is more than 70 If sugar is still less than 70, treat again with 15 grams of carbohydrate  Don't drive the hour of hypoglycemia  If unconscious/unable to eat or drink by mouth, use glucagon injection or nasal spray baqsimi and call 911. Can repeat again in 15 min if still unconscious.  Return in about 4 weeks (around 07/24/2023).   I have reviewed current medications, nurse's notes, allergies, vital signs, past medical and surgical history, family medical history, and social history for this encounter. Counseled patient on symptoms, examination findings, lab findings, imaging results, treatment decisions and monitoring and prognosis.  The patient understood the recommendations and agrees with the treatment plan. All questions regarding treatment plan were fully answered.  Christopher Birmingham, MD  06/26/23   History of Present Illness Christopher [name] is a 23 y.o. year old male who presents for evaluation of obesity, history of pre-diabetes and hyperinsulinism.  Seen by pediatric endocrinologist in past and mom says he had work up done for pituitary given his tall stautue.   Diabetes education -  History of autism diagnosed at 7 months.  Patient has a history of aggression in the past.  Had a meltdown in the clinic while he mother was providing history.  He calmed down soon and requested a signature from the provider.  [facility].  He had been referred to [facility] peds endocrinology in 12/2014 after being evaluated by [facility] who noted extensive acanthosis nigricans.  Blood work at his initial visit at [facility] showed markedly elevated nonfasting insulin  level of 2017.6 (normal <25) with A1c 6.2%; he was started on metformin  at that time.  Since then, insulin  levels have improved with most recent nonfasting insulin  level in 04/2016 of 19.9 with A1c of 5.3%.  He has seen [facility] (most recent visit 04/2016) and has undergone extensive testing for genetic cause of overgrowth syndrome/autism including the following:    Negative Fragile X DNA study  Negative seq and del/dup studies for PTEN gene  Negative chromosome and microarray  Negative methylation for BWS  Negative seq variants in the INSR gene   Home diabetes regimen: Metformin  500 mg every day   Physical Exam  BP 118/62 (BP Location: Left Arm, Patient Position: Sitting, Cuff Size: Large)   Pulse 84   Resp 18   Ht 6' 3.5 (1.918 m)   Wt 298 lb 3.2 oz (135.3 kg)   SpO2 97%   BMI 36.78 kg/m    Constitutional: well developed, well nourished Head: normocephalic, atraumatic Eyes: sclera anicteric, no  redness Neck: supple Lungs: normal respiratory effort Neurology: alert and oriented Skin: dry, no appreciable rashes Musculoskeletal: no appreciable defects Psychiatric: normal mood and affect Diabetic Foot Exam - Simple   No data filed      Current Medications Patient's Medications  New Prescriptions   No medications on file  Previous Medications   AMBULATORY NON FORMULARY MEDICATION    Medication Name- Nitroglycerin gel 0.125% TID PR   FLUTICASONE (FLONASE) 50 MCG/ACT NASAL SPRAY    Place 1 spray into both nostrils as needed.   LEVOCETIRIZINE (XYZAL ) 5 MG TABLET    Take 1 tablet (5 mg total) by mouth at bedtime. Take for 1 week then may be used as needed for allergy symptoms.   MELOXICAM  (MOBIC ) 15 MG TABLET    Take 1 tablet (15 mg total) by mouth daily. For 2 weeks then as needed once daily for pain/aching.   METHYLCELLULOSE (CITRUCEL) ORAL POWDER    Take 1 packet by mouth daily.   NITROGLYCERIN (NITROGLYN) 2 % OINTMENT  Apply topically in the morning and at bedtime.   POLYETHYLENE GLYCOL POWDER (GLYCOLAX /MIRALAX ) 17 GM/SCOOP POWDER    Take 17 g by mouth daily.   PROPRANOLOL (INDERAL) 10 MG TABLET    Take 5 mg by mouth 2 (two) times daily.   SERTRALINE  (ZOLOFT ) 50 MG TABLET    Take 25 mg by mouth daily.   VITAMIN D , ERGOCALCIFEROL , (DRISDOL ) 1.25 MG (50000 UNIT) CAPS CAPSULE    TAKE 1 CAPSULE BY MOUTH EVERY 7 DAYS  Modified Medications   Modified Medication Previous Medication   METFORMIN  (GLUCOPHAGE ) 500 MG TABLET metFORMIN  (GLUCOPHAGE ) 500 MG tablet      Take 2 tablets (1,000 mg total) by mouth 2 (two) times daily with a meal.    TAKE 1 TABLET(500 MG) BY MOUTH DAILY WITH BREAKFAST  Discontinued Medications   No medications on file    Allergies Allergies  Allergen Reactions   Abilify  [Aripiprazole ] Other (See Comments)    Caused aggression and Per [employer] pharmacogenomic testing, this is deemed to have a SIGNIFICANT gene-drug interaction   Anafranil [Clomipramine]  Other (See Comments)    Per [employer] pharmacogenomic testing, this is deemed to have SIGNIFICANT gene-drug interaction   Celexa [Citalopram] Other (See Comments)    Per [employer] pharmacogenomic testing, this is deemed to have SIGNIFICANT gene-drug interaction   Clozaril [Clozapine] Other (See Comments)    Per [employer] pharmacogenomic testing, this is deemed to have MODERATE gene-drug interaction   Concerta [Methylphenidate] Other (See Comments)    Per [employer] pharmacogenomic testing, this is deemed to have MODERATE gene-drug interaction   Cymbalta [Duloxetine Hcl] Other (See Comments)    Per [employer] pharmacogenomic testing, this is deemed to have MODERATE gene-drug interaction   Desyrel [Trazodone] Other (See Comments)    Per [employer] pharmacogenomic testing, this is deemed to have MODERATE gene-drug interaction   Effexor [Venlafaxine] Other (See Comments)    Per [employer] pharmacogenomic testing, this is deemed to have SIGNIFICANT gene-drug interaction   Elavil [Amitriptyline] Other (See Comments)    Per [employer] pharmacogenomic testing, this is deemed to have SIGNIFICANT gene-drug interaction   Emsam [Selegiline] Other (See Comments)    Per [employer] pharmacogenomic testing, this is deemed to have MODERATE gene-drug interaction   Fanapt [Iloperidone] Other (See Comments)    Per [employer] pharmacogenomic testing, this is deemed to have SIGNIFICANT gene-drug interaction   Focalin [Dexmethylphenidate] Other (See Comments)    Per [employer] pharmacogenomic testing, this is deemed to have MODERATE gene-drug interaction   Haldol [Haloperidol] Other (See Comments)    Per [employer] pharmacogenomic testing, this is deemed to have MODERATE gene-drug interaction   Inderal [Propranolol] Other (See Comments)    Per [employer] pharmacogenomic testing, this is deemed to have MODERATE gene-drug interaction   Lexapro [Escitalopram] Other (See Comments)    Per [employer]  pharmacogenomic testing, this is deemed to have SIGNIFICANT gene-drug interaction   Luvox [Fluvoxamine] Other (See Comments)    Per [employer] pharmacogenomic testing, this is deemed to have MODERATE gene-drug interaction   Mellaril [Thioridazine] Other (See Comments)    Per [employer] pharmacogenomic testing, this is deemed to have SIGNIFICANT gene-drug interaction   Navane [Thiothixene (Tiotixene)] Other (See Comments)    Per [employer] pharmacogenomic testing, this is deemed to have SIGNIFICANT gene-drug interaction   Norpramin [Desipramine] Other (See Comments)    Per [employer] pharmacogenomic testing, this is deemed to have SIGNIFICANT gene-drug interaction   Other Itching and Other (See Comments)    Seasonal allergies- Itchy eyes, runny nose, some wheezing  Pamelor [Nortriptyline] Other (See Comments)    Per [employer] pharmacogenomic testing, this is deemed to have SIGNIFICANT gene-drug interaction   Paxil [Paroxetine] Other (See Comments)    Per [employer] pharmacogenomic testing, this is deemed to have SIGNIFICANT gene-drug interaction   Prolixin [Fluphenazine] Other (See Comments)    Per [employer] pharmacogenomic testing, this is deemed to have a moderate gene-drug interaction   Prozac  [Fluoxetine ] Other (See Comments)    Per [employer] pharmacogenomic testing, this is deemed to have SIGNIFICANT gene-drug interaction   Remeron [Mirtazapine] Other (See Comments)    Per [employer] pharmacogenomic testing, this is deemed to have MODERATE gene-drug interaction   Rexulti [Brexpiprazole] Other (See Comments)    Per [employer] pharmacogenomic testing, this is deemed to have SIGNIFICANT gene-drug interaction   Risperdal [Risperidone] Other (See Comments)    Per [employer] pharmacogenomic testing, this is deemed to have SIGNIFICANT gene-drug interaction   Saphris [Asenapine] Other (See Comments)    Per [employer] pharmacogenomic testing, this is deemed to have MODERATE  gene-drug interaction   Seroquel [Quetiapine] Other (See Comments)    Per [employer] pharmacogenomic testing, this is deemed to have MODERATE gene-drug interaction   Sinequan [Doxepin] Other (See Comments)    Per [employer] pharmacogenomic testing, this is deemed to have SIGNIFICANT gene-drug interaction   Thorazine [Chlorpromazine] Other (See Comments)    Per [employer] pharmacogenomic testing, this is deemed to have MODERATE gene-drug interaction   Tofranil [Imipramine] Other (See Comments)    Per [employer] pharmacogenomic testing, this is deemed to have SIGNIFICANT gene-drug interaction   Trilafon [Perphenazine] Other (See Comments)    Per [employer] pharmacogenomic testing, this is deemed to have MODERATE gene-drug interaction   Trintellix [Vortioxetine] Other (See Comments)    Per [employer] pharmacogenomic testing, this is deemed to have SIGNIFICANT gene-drug interaction   Valium [Diazepam] Other (See Comments)    Per [employer] pharmacogenomic testing, this is deemed to have MODERATE gene-drug interaction   Viibryd [Vilazodone Hcl] Other (See Comments)    Per [employer] pharmacogenomic testing, this is deemed to have MODERATE gene-drug interaction   Wellbutrin [Bupropion] Other (See Comments)    Per [employer] pharmacogenomic testing, this is deemed to have SIGNIFICANT gene-drug interaction   Zoloft  [Sertraline ] Other (See Comments)    Per [employer] pharmacogenomic testing, this is deemed to have MODERATE gene-drug interaction   Zyprexa [Olanzapine] Other (See Comments)    Per [employer] pharmacogenomic testing, this is deemed to have MODERATE gene-drug interaction    Past Medical History Past Medical History:  Diagnosis Date   Allergy    Anxiety, generalized    Autism    Diagnosed at 62 months of age when mom noted he was not developing the same as his cousin   Depression    Hyperinsulinemia    eval at [facility] prior to 2024   OCD (obsessive compulsive disorder)     Tall stature    Vitamin D  deficiency     Past Surgical History Past Surgical History:  Procedure Laterality Date   NO PAST SURGERIES  2024    Family History family history includes Atrial fibrillation in his maternal grandmother; Cancer in his maternal great-grandmother; Diabetes in his maternal grandfather; Hypertension in his maternal grandfather, maternal grandmother, and mother; Hypothyroidism in his maternal grandmother; Insulin  resistance in his mother; Kidney cancer in his father; Kidney disease in his cousin and father; Kidney failure in his maternal grandfather; Pancreatitis in his father.  Social History Social History   Socioeconomic History   Marital status: Single  Spouse name: Not on file   Number of children: 0   Years of education: Not on file   Highest education level: Not on file  Occupational History   Occupation: Student  Tobacco Use   Smoking status: Never   Smokeless tobacco: Never  Vaping Use   Vaping status: Never Used  Substance and Sexual Activity   Alcohol use: Never   Drug use: Never   Sexual activity: Never  Other Topics Concern   Not on file  Social History Narrative   Lives with MGM, mom and younger brother.   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  Has phobia about insects and bugs.   High anxiety about school.  formerly [employer].   09/2022.     Social Drivers of Corporate Investment Banker Strain: Not on file  Food Insecurity: Not on file  Transportation Needs: Not on file  Physical Activity: Not on file  Stress: Not on file  Social Connections: Not on file  Intimate Partner Violence: Not on file    Lab Results  Component Value Date   HGBA1C 5.6 06/18/2023   HGBA1C 5.7 (H) 01/23/2023   HGBA1C 5.8 (H) 10/03/2022   Lab Results  Component Value Date   CHOL 146 06/18/2023   Lab Results  Component Value Date   HDL 30 (L) 06/18/2023   Lab Results  Component Value Date    LDLCALC 92 06/18/2023   Lab Results  Component Value Date   TRIG 139 06/18/2023   Lab Results  Component Value Date   CHOLHDL 4.9 06/18/2023   Lab Results  Component Value Date   CREATININE 0.85 06/18/2023   No results found for: GFR Lab Results  Component Value Date   MICROALBUR 0.5 06/19/2023      Component Value Date/Time   NA 139 06/18/2023 0927   NA 140 01/23/2023 1212   K 4.3 06/18/2023 0927   CL 104 06/18/2023 0927   CO2 27 06/18/2023 0927   GLUCOSE 100 (H) 06/18/2023 0927   BUN 8 06/18/2023 0927   BUN 5 (L) 01/23/2023 1212   CREATININE 0.85 06/18/2023 0927   CALCIUM 9.5 06/18/2023 0927   PROT 7.2 06/18/2023 0927   PROT 7.1 10/03/2022 1012   ALBUMIN 4.4 10/03/2022 1012   AST 21 06/18/2023 0927   ALT 27 06/18/2023 0927   ALKPHOS 66 10/03/2022 1012   BILITOT 0.4 06/18/2023 0927   BILITOT 0.3 10/03/2022 1012   GFRNONAA NOT CALCULATED 12/09/2019 1937   GFRAA NOT CALCULATED 12/09/2019 1937      Latest Ref Rng & Units 06/18/2023    9:27 AM 01/23/2023   12:12 PM 10/03/2022   10:12 AM  BMP  Glucose 65 - 99 mg/dL 899  96  84   BUN 7 - 25 mg/dL 8  5  7    Creatinine 0.60 - 1.24 mg/dL 9.14  9.03  9.10   BUN/Creat Ratio 6 - 22 (calc) SEE NOTE:  5  8   Sodium 135 - 146 mmol/L 139  140  137   Potassium 3.5 - 5.3 mmol/L 4.3  4.4  4.8   Chloride 98 - 110 mmol/L 104  103  102   CO2 20 - 32 mmol/L 27  23  21    Calcium 8.6 - 10.3 mg/dL 9.5  9.5  9.4        Component Value Date/Time   WBC 6.4 10/03/2022 1012   WBC 7.7 12/09/2019  1937   RBC 5.63 10/03/2022 1012   RBC 5.03 12/09/2019 1937   HGB 15.9 10/03/2022 1012   HCT 46.7 10/03/2022 1012   PLT 372 10/03/2022 1012   MCV 83 10/03/2022 1012   MCH 28.2 10/03/2022 1012   MCH 29.0 12/09/2019 1937   MCHC 34.0 10/03/2022 1012   MCHC 32.5 12/09/2019 1937   RDW 13.3 10/03/2022 1012   LYMPHSABS 2.7 10/03/2022 1012   MONOABS 0.7 12/09/2019 1937   EOSABS 0.1 10/03/2022 1012   BASOSABS 0.0 10/03/2022 1012     Parts  of this note may have been dictated using voice recognition software. There may be variances in spelling and vocabulary which are unintentional. Not all errors are proofread. Please notify the dino if any discrepancies are noted or if the meaning of any statement is not clear.

## 2023-06-26 NOTE — Patient Instructions (Addendum)
 Will recommend the following: Continue Metformin  500 gm qam with break fast  Week 1: one tablet after breakfast and one after dinner  Week 2: one tablet after breakfast and two after dinner  Week 3 and onwards: two tablets after breakfast and two after dinner

## 2023-07-04 ENCOUNTER — Other Ambulatory Visit: Payer: Self-pay

## 2023-07-24 ENCOUNTER — Other Ambulatory Visit: Payer: Self-pay | Admitting: "Endocrinology

## 2023-07-24 ENCOUNTER — Telehealth: Payer: Self-pay | Admitting: Endocrinology

## 2023-07-24 MED ORDER — METFORMIN HCL 500 MG PO TABS: 1000.0000 mg | ORAL_TABLET | Freq: Two times a day (BID) | ORAL | 3 refills | Status: AC

## 2023-07-24 NOTE — Telephone Encounter (Signed)
 Spoke with pharmacy regarding the patient's RX. Per the rep mom misunderstood, it was insurance company that needed to be contacted for the override. Per rep she spoke with patient's mom this morning and clarified with her.

## 2023-07-24 NOTE — Telephone Encounter (Signed)
 Patient has only 1 day of Metformin left at his new dosing instructions : Take 2 tablets (1,000 mg total) by mouth 2 (two) times daily with a meal., Starting Wed 06/26/2023   Per patient's mother pharmacy wont fill the new RX sent over to them because their records show PT should not need a "refill" at this time.  Pharmacy advised mother that they need an override from this office to fill the new RX.  Pharmacy is [facility] on [address] back [phone] with any questions or concerns

## 2023-07-29 ENCOUNTER — Encounter: Payer: Self-pay | Admitting: "Endocrinology

## 2023-07-29 ENCOUNTER — Other Ambulatory Visit: Payer: Self-pay

## 2023-07-29 ENCOUNTER — Ambulatory Visit (INDEPENDENT_AMBULATORY_CARE_PROVIDER_SITE_OTHER): Payer: 59 | Admitting: "Endocrinology

## 2023-07-29 VITALS — BP 130/80 | HR 105 | Ht 75.5 in | Wt 305.2 lb

## 2023-07-29 DIAGNOSIS — E88819 Insulin resistance, unspecified: Secondary | ICD-10-CM | POA: Diagnosis not present

## 2023-07-29 DIAGNOSIS — E66812 Obesity, class 2: Secondary | ICD-10-CM

## 2023-07-29 DIAGNOSIS — R7303 Prediabetes: Secondary | ICD-10-CM

## 2023-07-29 DIAGNOSIS — Z6836 Body mass index (BMI) 36.0-36.9, adult: Secondary | ICD-10-CM | POA: Diagnosis not present

## 2023-07-29 MED ORDER — LANCETS MISC. MISC: 1.0000 | Freq: Three times a day (TID) | 3 refills | Status: AC

## 2023-07-29 MED ORDER — LANCET DEVICE MISC: 1.0000 | Freq: Three times a day (TID) | 0 refills | Status: AC

## 2023-07-29 MED ORDER — TIRZEPATIDE-WEIGHT MANAGEMENT 2.5 MG/0.5ML ~~LOC~~ SOLN
2.5000 mg | SUBCUTANEOUS | 2 refills | Status: DC
Start: 2023-07-29 — End: 2023-07-29
  Filled 2023-07-29: qty 2, 28d supply, fill #0

## 2023-07-29 MED ORDER — TIRZEPATIDE-WEIGHT MANAGEMENT 2.5 MG/0.5ML ~~LOC~~ SOAJ
2.5000 mg | SUBCUTANEOUS | 3 refills | Status: DC
  Filled 2023-07-29: qty 2, 28d supply, fill #0

## 2023-07-29 MED ORDER — BLOOD GLUCOSE MONITORING SUPPL DEVI: 1.0000 | Freq: Three times a day (TID) | 0 refills | Status: AC

## 2023-07-29 MED ORDER — BLOOD GLUCOSE TEST VI STRP: 1.0000 | ORAL_STRIP | Freq: Three times a day (TID) | 3 refills | Status: AC

## 2023-07-29 NOTE — Patient Instructions (Signed)

## 2023-07-29 NOTE — Progress Notes (Signed)
 Outpatient Endocrinology Note Christopher [place], MD  07/29/23   Christopher [name] 21-17-2002 [id number]  Referring Provider: Jac Canavan, PA-C Primary Care Provider: Jac Canavan, PA-C Reason for consultation: Subjective   Assessment & Plan  Diagnoses and all orders for this visit:  Prediabetes  Class 2 severe obesity due to excess calories with serious comorbidity and body mass index (BMI) of 36.0 to 36.9 in adult (HCC)  Insulin resistance  Other orders -     Discontinue: tirzepatide (ZEPBOUND) 2.5 MG/0.5ML injection vial; Inject 2.5 mg into the skin once a week. -     Blood Glucose Monitoring Suppl DEVI; 1 each by Does not apply route in the morning, at noon, and at bedtime. May substitute to any manufacturer covered by patient's insurance. -     Glucose Blood (BLOOD GLUCOSE TEST STRIPS) STRP; 1 each by In Vitro route in the morning, at noon, and at bedtime. May substitute to any manufacturer covered by patient's insurance. -     Lancet Device MISC; 1 each by Does not apply route in the morning, at noon, and at bedtime. May substitute to any manufacturer covered by patient's insurance. -     Lancets Misc. MISC; 1 each by Does not apply route in the morning, at noon, and at bedtime. May substitute to any manufacturer covered by patient's insurance.   History of Pre-diabetes, No results found for: "GFR" A1C 5.7-5.8 in 2024, now improved to 5.6  Hba1c goal less than 7, current Hba1c is  Lab Results  Component Value Date   HGBA1C 5.6 06/18/2023   Will recommend the following: Continue Metformin 500 mg 2 pills bid Ordered Zepbound 2.5 mg/week, needs PA Mother is concerned about his weight Patient is in day-care, in verbal but has very limited eye contact/communication  07/29/23 Ordered glucose checking supplies and educated how to check BG  No known contraindications/side effects to any of above medications No history of MEN syndrome/medullary thyroid  cancer/pancreatitis or pancreatic cancer in self or family  -Last LD and Tg are as follows: Lab Results  Component Value Date   LDLCALC 92 06/18/2023    Lab Results  Component Value Date   TRIG 139 06/18/2023   -not on statin  -Follow low fat diet and exercise   -Blood pressure goal <140/90 - Microalbumin/creatinine goal is < 30 -Last MA/Cr is as follows: Lab Results  Component Value Date   MICROALBUR 0.5 06/19/2023   -not on ACE/ARB -diet changes including salt restriction -limit eating outside -counseled BP targets per standards of diabetes care -uncontrolled blood pressure can lead to retinopathy, nephropathy and cardiovascular and atherosclerotic heart disease  Reviewed and counseled on: -A1C target -Blood sugar targets -Complications of uncontrolled diabetes  -Checking blood sugar before meals and bedtime and bring log next visit -All medications with mechanism of action and side effects -Hypoglycemia management: rule of 15's, Glucagon Emergency Kit and medical alert ID -low-carb low-fat plate-method diet -At least 20 minutes of physical activity per day -Annual dilated retinal eye exam and foot exam -compliance and follow up needs -follow up as scheduled or earlier if problem gets worse  Call if blood sugar is less than 70 or consistently above 250    Take a 15 gm snack of carbohydrate at bedtime before you go to sleep if your blood sugar is less than 100.    If you are going to fast after midnight for a test or procedure, ask your physician for instructions on how to  reduce/decrease your insulin dose.    Call if blood sugar is less than 70 or consistently above 250  -Treating a low sugar by rule of 15  (15 gms of sugar every 15 min until sugar is more than 70) If you feel your sugar is low, test your sugar to be sure If your sugar is low (less than 70), then take 15 grams of a fast acting Carbohydrate (3-4 glucose tablets or glucose gel or 4 ounces of juice or  regular soda) Recheck your sugar 15 min after treating low to make sure it is more than 70 If sugar is still less than 70, treat again with 15 grams of carbohydrate          Don't drive the hour of hypoglycemia  If unconscious/unable to eat or drink by mouth, use glucagon injection or nasal spray baqsimi and call 911. Can repeat again in 15 min if still unconscious.  Return in about 6 weeks (around 09/09/2023).   I have reviewed current medications, nurse's notes, allergies, vital signs, past medical and surgical history, family medical history, and social history for this encounter. Counseled patient on symptoms, examination findings, lab findings, imaging results, treatment decisions and monitoring and prognosis. The patient understood the recommendations and agrees with the treatment plan. All questions regarding treatment plan were fully answered.  Christopher [place], MD  07/29/23   History of Present Illness Christopher [name] is a 23 y.o. year old male who presents for evaluation of obesity, history of pre-diabetes and hyperinsulinism.  Seen by pediatric endocrinologist in past and mom says he had work up done for pituitary given his tall stautue.   Diabetes education -  History of autism diagnosed at 21 months.  Patient has a history of aggression in the past.  Had a meltdown in the clinic while he mother was providing history.  He calmed down soon and requested a signature from the provider.  [facility].  He had been referred to [facility] peds endocrinology in 12/2014 after being evaluated by [facility] who noted extensive acanthosis nigricans.  Blood work at his initial visit at [facility] showed markedly elevated nonfasting insulin level of 2017.6 (normal <25) with A1c 6.2%; he was started on metformin at that time.  Since then, insulin levels have improved with most recent nonfasting insulin level in 04/2016 of 19.9 with A1c of 5.3%.  He has seen  [facility] (most recent visit 04/2016) and has undergone extensive testing for genetic cause of overgrowth syndrome/autism including the following:    Negative Fragile X DNA study  Negative seq and del/dup studies for PTEN gene  Negative chromosome and microarray  Negative methylation for BWS  Negative seq variants in the INSR gene   Home diabetes regimen: Metformin 500 mg 2 pills bid - no S/E Not checking sugar at home   Physical Exam  BP 130/80 (BP Location: Right Arm, Patient Position: Sitting)   Pulse (!) 105   Ht 6' 3.5" (1.918 m)   Wt (!) 305 lb 3.2 oz (138.4 kg)   SpO2 98%   BMI 37.64 kg/m    Constitutional: well developed, well nourished Head: normocephalic, atraumatic Eyes: sclera anicteric, no redness Neck: supple Lungs: normal respiratory effort Neurology: alert and oriented Skin: dry, no appreciable rashes Musculoskeletal: no appreciable defects Psychiatric: normal mood and affect Diabetic Foot Exam - Simple   No data filed      Current Medications Patient's Medications  New Prescriptions  BLOOD GLUCOSE MONITORING SUPPL DEVI    1 each by Does not apply route in the morning, at noon, and at bedtime. May substitute to any manufacturer covered by patient's insurance.   GLUCOSE BLOOD (BLOOD GLUCOSE TEST STRIPS) STRP    1 each by In Vitro route in the morning, at noon, and at bedtime. May substitute to any manufacturer covered by patient's insurance.   LANCET DEVICE MISC    1 each by Does not apply route in the morning, at noon, and at bedtime. May substitute to any manufacturer covered by patient's insurance.   LANCETS MISC. MISC    1 each by Does not apply route in the morning, at noon, and at bedtime. May substitute to any manufacturer covered by patient's insurance.   TIRZEPATIDE (ZEPBOUND) 2.5 MG/0.5ML PEN    Inject 2.5 mg into the skin once a week.  Previous Medications   AMBULATORY NON FORMULARY MEDICATION    Medication Name- Nitroglycerin gel 0.125%  TID PR   FLUTICASONE (FLONASE) 50 MCG/ACT NASAL SPRAY    Place 1 spray into both nostrils as needed.   LEVOCETIRIZINE (XYZAL) 5 MG TABLET    Take 1 tablet (5 mg total) by mouth at bedtime. Take for 1 week then may be used as needed for allergy symptoms.   MELOXICAM (MOBIC) 15 MG TABLET    Take 1 tablet (15 mg total) by mouth daily. For 2 weeks then as needed once daily for pain/aching.   METFORMIN (GLUCOPHAGE) 500 MG TABLET    Take 2 tablets (1,000 mg total) by mouth 2 (two) times daily with a meal.   METHYLCELLULOSE (CITRUCEL) ORAL POWDER    Take 1 packet by mouth daily.   NITROGLYCERIN (NITROGLYN) 2 % OINTMENT    Apply topically in the morning and at bedtime.   POLYETHYLENE GLYCOL POWDER (GLYCOLAX/MIRALAX) 17 GM/SCOOP POWDER    Take 17 g by mouth daily.   PROPRANOLOL (INDERAL) 10 MG TABLET    Take 5 mg by mouth 2 (two) times daily.   SERTRALINE (ZOLOFT) 50 MG TABLET    Take 25 mg by mouth daily.   VITAMIN D, ERGOCALCIFEROL, (DRISDOL) 1.25 MG (50000 UNIT) CAPS CAPSULE    TAKE 1 CAPSULE BY MOUTH EVERY 7 DAYS  Modified Medications   No medications on file  Discontinued Medications   No medications on file    Allergies Allergies  Allergen Reactions   Abilify [Aripiprazole] Other (See Comments)    Caused aggression and Per "[employer]" pharmacogenomic testing, this is deemed to have a SIGNIFICANT gene-drug interaction   Anafranil [Clomipramine] Other (See Comments)    Per "[employer]es[employer]" pharmacogenomic testing, this is deemed to have SIGNIFICANT gene-drug interaction   Celexa [Citalopram] Other (See Comments)    Per "Genesight" pharmacogenomic testing, this is deemed to have SIGNIFICANT gene-drug interaction   Clozaril [Clozapine] Other (See Comments)    Per "[employer]esight" pharmacogenomic testing, this is deemed to have MODERATE gene-drug interaction   Concerta [Methylphenidate] Other (See Comments)    Per "[employer]esight" pharmacogenomic testing, this is deemed to have MODERATE gene-drug interaction    Cymbalta [Duloxetine Hcl] Other (See Comments)    Per "Genesight" pharmacogenomic testing, this is deemed to have MODERATE gene-drug interaction   Desyrel [Trazodone] Other (See Comments)    Per "[employer]esight" pharmacogenomic testing, this is deemed to have MODERATE gene-drug interaction   Effexor [Venlafaxine] Other (See Comments)    Per "[employer]esight" pharmacogenomic testing, this is deemed to have SIGNIFICANT gene-drug interaction   Elavil [Amitriptyline] Other (See Comments)  Per "[employer]esight" pharmacogenomic testing, this is deemed to have SIGNIFICANT gene-drug interaction   Emsam [Selegiline] Other (See Comments)    Per "[employer]esight" pharmacogenomic testing, this is deemed to have MODERATE gene-drug interaction   Fanapt [Iloperidone] Other (See Comments)    Per "[employer]esight" pharmacogenomic testing, this is deemed to have SIGNIFICANT gene-drug interaction   Focalin [Dexmethylphenidate] Other (See Comments)    Per "Genesight" pharmacogenomic testing, this is deemed to have MODERATE gene-drug interaction   Haldol [Haloperidol] Other (See Comments)    Per "Genesight" pharmacogenomic testing, this is deemed to have MODERATE gene-drug interaction   Inderal [Propranolol] Other (See Comments)    Per "Genesight" pharmacogenomic testing, this is deemed to have MODERATE gene-drug interaction   Lexapro [Escitalopram] Other (See Comments)    Per "[employer]esight" pharmacogenomic testing, this is deemed to have SIGNIFICANT gene-drug interaction   Luvox [Fluvoxamine] Other (See Comments)    Per "Genesight" pharmacogenomic testing, this is deemed to have MODERATE gene-drug interaction   Mellaril [Thioridazine] Other (See Comments)    Per "Genesight" pharmacogenomic testing, this is deemed to have SIGNIFICANT gene-drug interaction   Navane [Thiothixene (Tiotixene)] Other (See Comments)    Per "[employer]esight" pharmacogenomic testing, this is deemed to have SIGNIFICANT gene-drug interaction   Norpramin [Desipramine]  Other (See Comments)    Per "[employer]es[employer]" pharmacogenomic testing, this is deemed to have SIGNIFICANT gene-drug interaction   Other Itching and Other (See Comments)    Seasonal allergies- Itchy eyes, runny nose, some wheezing   Pamelor [Nortriptyline] Other (See Comments)    Per "[employer]esight" pharmacogenomic testing, this is deemed to have SIGNIFICANT gene-drug interaction   Paxil [Paroxetine] Other (See Comments)    Per "Genesight" pharmacogenomic testing, this is deemed to have SIGNIFICANT gene-drug interaction   Prolixin [Fluphenazine] Other (See Comments)    Per "Genesight" pharmacogenomic testing, this is deemed to have a moderate gene-drug interaction   Prozac [Fluoxetine] Other (See Comments)    Per "[employer]esight" pharmacogenomic testing, this is deemed to have SIGNIFICANT gene-drug interaction   Remeron [Mirtazapine] Other (See Comments)    Per "[employer]esight" pharmacogenomic testing, this is deemed to have MODERATE gene-drug interaction   Rexulti [Brexpiprazole] Other (See Comments)    Per "Genesight" pharmacogenomic testing, this is deemed to have SIGNIFICANT gene-drug interaction   Risperdal [Risperidone] Other (See Comments)    Per "Genesight" pharmacogenomic testing, this is deemed to have SIGNIFICANT gene-drug interaction   Saphris [Asenapine] Other (See Comments)    Per "Genesight" pharmacogenomic testing, this is deemed to have MODERATE gene-drug interaction   Seroquel [Quetiapine] Other (See Comments)    Per "Genesight" pharmacogenomic testing, this is deemed to have MODERATE gene-drug interaction   Sinequan [Doxepin] Other (See Comments)    Per "Genesight" pharmacogenomic testing, this is deemed to have SIGNIFICANT gene-drug interaction   Thorazine [Chlorpromazine] Other (See Comments)    Per "Genesight" pharmacogenomic testing, this is deemed to have MODERATE gene-drug interaction   Tofranil [Imipramine] Other (See Comments)    Per "[employer]esight" pharmacogenomic testing, this is  deemed to have SIGNIFICANT gene-drug interaction   Trilafon [Perphenazine] Other (See Comments)    Per "Genesight" pharmacogenomic testing, this is deemed to have MODERATE gene-drug interaction   Trintellix [Vortioxetine] Other (See Comments)    Per "Genesight" pharmacogenomic testing, this is deemed to have SIGNIFICANT gene-drug interaction   Valium [Diazepam] Other (See Comments)    Per "Genesight" pharmacogenomic testing, this is deemed to have MODERATE gene-drug interaction   Viibryd [Vilazodone Hcl] Other (See Comments)    Per "Genesight" pharmacogenomic testing,  this is deemed to have MODERATE gene-drug interaction   Wellbutrin [Bupropion] Other (See Comments)    Per "[employer]esight" pharmacogenomic testing, this is deemed to have SIGNIFICANT gene-drug interaction   Zoloft [Sertraline] Other (See Comments)    Per "Genesight" pharmacogenomic testing, this is deemed to have MODERATE gene-drug interaction   Zyprexa [Olanzapine] Other (See Comments)    Per "[employer]esight" pharmacogenomic testing, this is deemed to have MODERATE gene-drug interaction    Past Medical History Past Medical History:  Diagnosis Date   Allergy    Anxiety, generalized    Autism    Diagnosed at 65 months of age when mom noted he was not developing the same as his cousin   Depression    Hyperinsulinemia    eval at [facility] prior to 2024   OCD (obsessive compulsive disorder)    Tall stature    Vitamin D deficiency     Past Surgical History Past Surgical History:  Procedure Laterality Date   NO PAST SURGERIES  2024    Family History family history includes Atrial fibrillation in his maternal grandmother; Cancer in his maternal great-grandmother; Diabetes in his maternal grandfather; Hypertension in his maternal grandfather, maternal grandmother, and mother; Hypothyroidism in his maternal grandmother; Insulin resistance in his mother; Kidney cancer in his father; Kidney disease in his cousin and father; Kidney  failure in his maternal grandfather; Pancreatitis in his father.  Social History Social History   Socioeconomic History   Marital status: Single    Spouse name: Not on file   Number of children: 0   Years of education: Not on file   Highest education level: Not on file  Occupational History   Occupation: Student  Tobacco Use   Smoking status: Never   Smokeless tobacco: Never  Vaping Use   Vaping status: Never Used  Substance and Sexual Activity   Alcohol use: Never   Drug use: Never   Sexual activity: Never  Other Topics Concern   Not on file  Social History Narrative   Lives with MGM, mom and younger brother.   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  Has phobia about insects and bugs.   High anxiety about school.  formerly [employer].   09/2022.     Social Drivers of Corporate investment banker Strain: Not on file  Food Insecurity: Not on file  Transportation Needs: Not on file  Physical Activity: Not on file  Stress: Not on file  Social Connections: Not on file  Intimate Partner Violence: Not on file    Lab Results  Component Value Date   HGBA1C 5.6 06/18/2023   HGBA1C 5.7 (H) 01/23/2023   HGBA1C 5.8 (H) 10/03/2022   Lab Results  Component Value Date   CHOL 146 06/18/2023   Lab Results  Component Value Date   HDL 30 (L) 06/18/2023   Lab Results  Component Value Date   LDLCALC 92 06/18/2023   Lab Results  Component Value Date   TRIG 139 06/18/2023   Lab Results  Component Value Date   CHOLHDL 4.9 06/18/2023   Lab Results  Component Value Date   CREATININE 0.85 06/18/2023   No results found for: "GFR" Lab Results  Component Value Date   MICROALBUR 0.5 06/19/2023      Component Value Date/Time   NA 139 06/18/2023 0927   NA 140 01/23/2023 1212   K 4.3 06/18/2023 0927   CL 104 06/18/2023 0927  CO2 27 06/18/2023 0927   GLUCOSE 100 (H) 06/18/2023 0927   BUN 8 06/18/2023 0927   BUN 5 (L)  01/23/2023 1212   CREATININE 0.85 06/18/2023 0927   CALCIUM 9.5 06/18/2023 0927   PROT 7.2 06/18/2023 0927   PROT 7.1 10/03/2022 1012   ALBUMIN 4.4 10/03/2022 1012   AST 21 06/18/2023 0927   ALT 27 06/18/2023 0927   ALKPHOS 66 10/03/2022 1012   BILITOT 0.4 06/18/2023 0927   BILITOT 0.3 10/03/2022 1012   GFRNONAA NOT CALCULATED 12/09/2019 1937   GFRAA NOT CALCULATED 12/09/2019 1937      Latest Ref Rng & Units 06/18/2023    9:27 AM 01/23/2023   12:12 PM 10/03/2022   10:12 AM  BMP  Glucose 65 - 99 mg/dL 578  96  84   BUN 7 - 25 mg/dL 8  5  7    Creatinine 0.60 - 1.24 mg/dL 4.69  6.29  5.28   BUN/Creat Ratio 6 - 22 (calc) SEE NOTE:  5  8   Sodium 135 - 146 mmol/L 139  140  137   Potassium 3.5 - 5.3 mmol/L 4.3  4.4  4.8   Chloride 98 - 110 mmol/L 104  103  102   CO2 20 - 32 mmol/L 27  23  21    Calcium 8.6 - 10.3 mg/dL 9.5  9.5  9.4        Component Value Date/Time   WBC 6.4 10/03/2022 1012   WBC 7.7 12/09/2019 1937   RBC 5.63 10/03/2022 1012   RBC 5.03 12/09/2019 1937   HGB 15.9 10/03/2022 1012   HCT 46.7 10/03/2022 1012   PLT 372 10/03/2022 1012   MCV 83 10/03/2022 1012   MCH 28.2 10/03/2022 1012   MCH 29.0 12/09/2019 1937   MCHC 34.0 10/03/2022 1012   MCHC 32.5 12/09/2019 1937   RDW 13.3 10/03/2022 1012   LYMPHSABS 2.7 10/03/2022 1012   MONOABS 0.7 12/09/2019 1937   EOSABS 0.1 10/03/2022 1012   BASOSABS 0.0 10/03/2022 1012     Parts of this note may have been dictated using voice recognition software. There may be variances in spelling and vocabulary which are unintentional. Not all errors are proofread. Please notify the Thereasa Parkin if any discrepancies are noted or if the meaning of any statement is not clear.

## 2023-07-30 ENCOUNTER — Other Ambulatory Visit: Payer: Self-pay

## 2023-08-02 ENCOUNTER — Other Ambulatory Visit: Payer: Self-pay

## 2023-08-05 ENCOUNTER — Other Ambulatory Visit: Payer: Self-pay

## 2023-08-06 ENCOUNTER — Other Ambulatory Visit: Payer: Self-pay

## 2023-08-07 ENCOUNTER — Other Ambulatory Visit: Payer: Self-pay

## 2023-08-09 ENCOUNTER — Other Ambulatory Visit: Payer: Self-pay

## 2023-08-12 ENCOUNTER — Other Ambulatory Visit: Payer: Self-pay

## 2023-08-12 ENCOUNTER — Other Ambulatory Visit (HOSPITAL_COMMUNITY): Payer: Self-pay

## 2023-08-12 ENCOUNTER — Telehealth: Payer: Self-pay

## 2023-08-12 NOTE — Telephone Encounter (Signed)
*  Endo  Pharmacy Patient Advocate Encounter   Received notification from CoverMyMeds that prior authorization for Zepbound 2.5MG /0.5ML pen-injectors  is required/requested.   Insurance verification completed.   The patient is insured through [facility] .   Per test claim: PA required; PA submitted to above mentioned insurance via CoverMyMeds Key/confirmation #/EOC [id number] Status is pending

## 2023-08-13 ENCOUNTER — Other Ambulatory Visit: Payer: Self-pay

## 2023-08-16 NOTE — Telephone Encounter (Signed)
 Your requested medication is an anti-obesity medication that is excluded from Part D prescription coverage under Medicare rules, unless it is being used for a medically-accepted diagnosis approved by the Food and Drug Administration. Please refer to your Evidence of Coverage (EOC) section that references Part D drug coverage in your pharmacy plan documents for more information.

## 2023-08-19 ENCOUNTER — Other Ambulatory Visit: Payer: Self-pay

## 2023-08-20 ENCOUNTER — Other Ambulatory Visit: Payer: Self-pay

## 2023-08-28 ENCOUNTER — Other Ambulatory Visit: Payer: Self-pay

## 2023-08-28 NOTE — Telephone Encounter (Signed)
 Yes, it was denied because his insurance does not cover anti-obesity medications.

## 2023-09-10 ENCOUNTER — Ambulatory Visit: Admitting: "Endocrinology

## 2023-10-09 ENCOUNTER — Other Ambulatory Visit (HOSPITAL_COMMUNITY): Payer: Self-pay

## 2023-10-09 ENCOUNTER — Telehealth: Payer: Self-pay

## 2023-10-09 ENCOUNTER — Encounter: Payer: Self-pay | Admitting: "Endocrinology

## 2023-10-09 ENCOUNTER — Ambulatory Visit (INDEPENDENT_AMBULATORY_CARE_PROVIDER_SITE_OTHER): Admitting: "Endocrinology

## 2023-10-09 VITALS — BP 124/80 | HR 84 | Ht 75.5 in | Wt 302.0 lb

## 2023-10-09 DIAGNOSIS — E88819 Insulin resistance, unspecified: Secondary | ICD-10-CM

## 2023-10-09 DIAGNOSIS — E66812 Obesity, class 2: Secondary | ICD-10-CM | POA: Diagnosis not present

## 2023-10-09 DIAGNOSIS — Z6836 Body mass index (BMI) 36.0-36.9, adult: Secondary | ICD-10-CM | POA: Diagnosis not present

## 2023-10-09 DIAGNOSIS — R7303 Prediabetes: Secondary | ICD-10-CM

## 2023-10-09 LAB — POCT GLYCOSYLATED HEMOGLOBIN (HGB A1C): Hemoglobin A1C: 5.5 % (ref 4.0–5.6)

## 2023-10-09 MED ORDER — RYBELSUS 3 MG PO TABS: 3.0000 mg | ORAL_TABLET | Freq: Every day | ORAL | 2 refills | Status: DC

## 2023-10-09 NOTE — Telephone Encounter (Signed)
 Pharmacy Patient Advocate Encounter   Received notification from CoverMyMeds that prior authorization for Rybelsus 3MG  tablets is required/requested.   Insurance verification completed.   The patient is insured through [facility] Medicare Part D.   Per test claim: PA required; PA submitted to above mentioned insurance via CoverMyMeds Key/confirmation #/EOC [id number] Status is pending

## 2023-10-09 NOTE — Progress Notes (Signed)
 Outpatient Endocrinology Note Christopher Newcomer, MD  10/09/23   Christopher [name] Oct 23, 2008 [id number]  Referring Provider: Claudene Crystal, PA-C Primary Care Provider: Claudene Crystal, PA-C Reason for consultation: Subjective   Assessment & Plan  Diagnoses and all orders for this visit:  Prediabetes -     POCT glycosylated hemoglobin (Hb A1C)  Class 2 severe obesity due to excess calories with serious comorbidity and body mass index (BMI) of 36.0 to 36.9 in adult [facility])  Insulin  resistance  Other orders -     Semaglutide (RYBELSUS) 3 MG TABS; Take 1 tablet (3 mg total) by mouth daily.   History of Pre-diabetes, No results found for: "GFR" A1C 5.7-5.8 in 2024, now improved to 5.6  Hba1c goal less than 7, current Hba1c is  Lab Results  Component Value Date   HGBA1C 5.5 10/09/2023   Will recommend the following: Continue Metformin  500 mg 2 pills bid Zepbound  denied-obesity meds not covered  Ordered rybelsus 3 mg qam 10/09/23: Contacted patient's psychiatrist about qysemia and contrave  Patient is in day-care, in verbal but has very limited eye contact/communication  07/29/23 Ordered glucose checking supplies and educated how to check BG  No known contraindications/side effects to any of above medications No history of MEN syndrome/medullary thyroid cancer/pancreatitis or pancreatic cancer in self or family  -Last LD and Tg are as follows: Lab Results  Component Value Date   LDLCALC 92 06/18/2023    Lab Results  Component Value Date   TRIG 139 06/18/2023   -not on statin  -Follow low fat diet and exercise   -Blood pressure goal <140/90 - Microalbumin/creatinine goal is < 30 -Last MA/Cr is as follows: Lab Results  Component Value Date   MICROALBUR 0.5 06/19/2023   -not on ACE/ARB -diet changes including salt restriction -limit eating outside -counseled BP targets per standards of diabetes care -uncontrolled blood pressure can lead to retinopathy,  nephropathy and cardiovascular and atherosclerotic heart disease  Reviewed and counseled on: -A1C target -Blood sugar targets -Complications of uncontrolled diabetes  -Checking blood sugar before meals and bedtime and bring log next visit -All medications with mechanism of action and side effects -Hypoglycemia management: rule of 15's, Glucagon Emergency Kit and medical alert ID -low-carb low-fat plate-method diet -At least 20 minutes of physical activity per day -Annual dilated retinal eye exam and foot exam -compliance and follow up needs -follow up as scheduled or earlier if problem gets worse  Call if blood sugar is less than 70 or consistently above 250    Take a 15 gm snack of carbohydrate at bedtime before you go to sleep if your blood sugar is less than 100.    If you are going to fast after midnight for a test or procedure, ask your physician for instructions on how to reduce/decrease your insulin  dose.    Call if blood sugar is less than 70 or consistently above 250  -Treating a low sugar by rule of 15  (15 gms of sugar every 15 min until sugar is more than 70) If you feel your sugar is low, test your sugar to be sure If your sugar is low (less than 70), then take 15 grams of a fast acting Carbohydrate (3-4 glucose tablets or glucose gel or 4 ounces of juice or regular soda) Recheck your sugar 15 min after treating low to make sure it is more than 70 If sugar is still less than 70, treat again with 15 grams of carbohydrate  Don't drive the hour of hypoglycemia  If unconscious/unable to eat or drink by mouth, use glucagon injection or nasal spray baqsimi and call 911. Can repeat again in 15 min if still unconscious.  Return in about 3 months (around 01/09/2024).   I have reviewed current medications, nurse's notes, allergies, vital signs, past medical and surgical history, family medical history, and social history for this encounter. Counseled patient on symptoms,  examination findings, lab findings, imaging results, treatment decisions and monitoring and prognosis. The patient understood the recommendations and agrees with the treatment plan. All questions regarding treatment plan were fully answered.  Christopher Newcomer, MD  10/09/23   History of Present Illness Christopher [name] is a 23 y.o. year old male who presents for evaluation of obesity, history of pre-diabetes and hyperinsulinism.   Home diabetes regimen: Metformin  500 mg 2 pills bid - no S/E Not checking sugar at home  No concerns at this point   Initial history:  Seen by pediatric endocrinologist in past and mom says he had work up done for pituitary given his tall stautue.   Diabetes education -  History of autism diagnosed at 76 months.  Patient has a history of aggression in the past.  Had a meltdown in the clinic while he mother was providing history.  He calmed down soon and requested a signature from the provider.  Previously [facility].  He had been referred to [facility] peds endocrinology in 12/2014 after being evaluated by [facility] who noted extensive acanthosis nigricans.  Blood work at his initial visit at [facility] showed markedly elevated nonfasting insulin  level of 2017.6 (normal <25) with A1c 6.2%; he was started on metformin  at that time.  Since then, insulin  levels have improved with most recent nonfasting insulin  level in 04/2016 of 19.9 with A1c of 5.3%.  He has seen [facility] (most recent visit 04/2016) and has undergone extensive testing for genetic cause of overgrowth syndrome/autism including the following:    Negative Fragile X DNA study  Negative seq and del/dup studies for PTEN gene  Negative chromosome and microarray  Negative methylation for BWS  Negative seq variants in the INSR gene    Physical Exam  BP 124/80   Pulse 84   Ht 6' 3.5" (1.918 m)   Wt (!) 302 lb (137 kg)   SpO2 98%   BMI 37.25 kg/m     Constitutional: well developed, well nourished Head: normocephalic, atraumatic Eyes: sclera anicteric, no redness Neck: supple Lungs: normal respiratory effort Neurology: alert and oriented Skin: dry, no appreciable rashes Musculoskeletal: no appreciable defects Psychiatric: normal mood and affect Diabetic Foot Exam - Simple   No data filed      Current Medications Patient's Medications  New Prescriptions   SEMAGLUTIDE (RYBELSUS) 3 MG TABS    Take 1 tablet (3 mg total) by mouth daily.  Previous Medications   AMBULATORY NON FORMULARY MEDICATION    Medication Name- Nitroglycerin gel 0.125% TID PR   BLOOD GLUCOSE MONITORING SUPPL DEVI    1 each by Does not apply route in the morning, at noon, and at bedtime. May substitute to any manufacturer covered by patient's insurance.   FLUTICASONE (FLONASE) 50 MCG/ACT NASAL SPRAY    Place 1 spray into both nostrils as needed.   GLUCOSE BLOOD (BLOOD GLUCOSE TEST STRIPS) STRP    1 each by In Vitro route in the morning, at noon, and at bedtime. May substitute to any manufacturer covered by patient's insurance.  LEVOCETIRIZINE (XYZAL ) 5 MG TABLET    Take 1 tablet (5 mg total) by mouth at bedtime. Take for 1 week then may be used as needed for allergy symptoms.   MELOXICAM  (MOBIC ) 15 MG TABLET    Take 1 tablet (15 mg total) by mouth daily. For 2 weeks then as needed once daily for pain/aching.   METFORMIN  (GLUCOPHAGE ) 500 MG TABLET    Take 2 tablets (1,000 mg total) by mouth 2 (two) times daily with a meal.   METHYLCELLULOSE (CITRUCEL) ORAL POWDER    Take 1 packet by mouth daily.   NITROGLYCERIN (NITROGLYN) 2 % OINTMENT    Apply topically in the morning and at bedtime.   POLYETHYLENE GLYCOL POWDER (GLYCOLAX /MIRALAX ) 17 GM/SCOOP POWDER    Take 17 g by mouth daily.   PROPRANOLOL (INDERAL) 10 MG TABLET    Take 5 mg by mouth 2 (two) times daily.   SERTRALINE  (ZOLOFT ) 50 MG TABLET    Take 25 mg by mouth daily.   VITAMIN D , ERGOCALCIFEROL , (DRISDOL ) 1.25  MG (50000 UNIT) CAPS CAPSULE    TAKE 1 CAPSULE BY MOUTH EVERY 7 DAYS  Modified Medications   No medications on file  Discontinued Medications   TIRZEPATIDE  (ZEPBOUND ) 2.5 MG/0.5ML PEN    Inject 2.5 mg into the skin once a week.    Allergies Allergies  Allergen Reactions   Abilify  [Aripiprazole ] Other (See Comments)    Caused aggression and Per "Genesight" pharmacogenomic testing, this is deemed to have a SIGNIFICANT gene-drug interaction   Anafranil [Clomipramine] Other (See Comments)    Per "Genesight" pharmacogenomic testing, this is deemed to have SIGNIFICANT gene-drug interaction   Celexa [Citalopram] Other (See Comments)    Per "Genesight" pharmacogenomic testing, this is deemed to have SIGNIFICANT gene-drug interaction   Clozaril [Clozapine] Other (See Comments)    Per "Genesight" pharmacogenomic testing, this is deemed to have MODERATE gene-drug interaction   Concerta [Methylphenidate] Other (See Comments)    Per "Genesight" pharmacogenomic testing, this is deemed to have MODERATE gene-drug interaction   Cymbalta [Duloxetine Hcl] Other (See Comments)    Per "Genesight" pharmacogenomic testing, this is deemed to have MODERATE gene-drug interaction   Desyrel [Trazodone] Other (See Comments)    Per "Genesight" pharmacogenomic testing, this is deemed to have MODERATE gene-drug interaction   Effexor [Venlafaxine] Other (See Comments)    Per "Genesight" pharmacogenomic testing, this is deemed to have SIGNIFICANT gene-drug interaction   Elavil [Amitriptyline] Other (See Comments)    Per "Genesight" pharmacogenomic testing, this is deemed to have SIGNIFICANT gene-drug interaction   Emsam [Selegiline] Other (See Comments)    Per "Genesight" pharmacogenomic testing, this is deemed to have MODERATE gene-drug interaction   Fanapt [Iloperidone] Other (See Comments)    Per "Genesight" pharmacogenomic testing, this is deemed to have SIGNIFICANT gene-drug interaction   Focalin  [Dexmethylphenidate] Other (See Comments)    Per "Genesight" pharmacogenomic testing, this is deemed to have MODERATE gene-drug interaction   Haldol [Haloperidol] Other (See Comments)    Per "Genesight" pharmacogenomic testing, this is deemed to have MODERATE gene-drug interaction   Inderal [Propranolol] Other (See Comments)    Per "Genesight" pharmacogenomic testing, this is deemed to have MODERATE gene-drug interaction   Lexapro [Escitalopram] Other (See Comments)    Per "Genesight" pharmacogenomic testing, this is deemed to have SIGNIFICANT gene-drug interaction   Luvox [Fluvoxamine] Other (See Comments)    Per "Genesight" pharmacogenomic testing, this is deemed to have MODERATE gene-drug interaction   Mellaril [Thioridazine] Other (See Comments)  Per "Genesight" pharmacogenomic testing, this is deemed to have SIGNIFICANT gene-drug interaction   Navane [Thiothixene (Tiotixene)] Other (See Comments)    Per "Genesight" pharmacogenomic testing, this is deemed to have SIGNIFICANT gene-drug interaction   Norpramin [Desipramine] Other (See Comments)    Per "Genesight" pharmacogenomic testing, this is deemed to have SIGNIFICANT gene-drug interaction   Other Itching and Other (See Comments)    Seasonal allergies- Itchy eyes, runny nose, some wheezing   Pamelor [Nortriptyline] Other (See Comments)    Per "Genesight" pharmacogenomic testing, this is deemed to have SIGNIFICANT gene-drug interaction   Paxil [Paroxetine] Other (See Comments)    Per "Genesight" pharmacogenomic testing, this is deemed to have SIGNIFICANT gene-drug interaction   Prolixin [Fluphenazine] Other (See Comments)    Per "Genesight" pharmacogenomic testing, this is deemed to have a moderate gene-drug interaction   Prozac  [Fluoxetine ] Other (See Comments)    Per "Genesight" pharmacogenomic testing, this is deemed to have SIGNIFICANT gene-drug interaction   Remeron [Mirtazapine] Other (See Comments)    Per "Genesight"  pharmacogenomic testing, this is deemed to have MODERATE gene-drug interaction   Rexulti [Brexpiprazole] Other (See Comments)    Per "Genesight" pharmacogenomic testing, this is deemed to have SIGNIFICANT gene-drug interaction   Risperdal [Risperidone] Other (See Comments)    Per "Genesight" pharmacogenomic testing, this is deemed to have SIGNIFICANT gene-drug interaction   Saphris [Asenapine] Other (See Comments)    Per "Genesight" pharmacogenomic testing, this is deemed to have MODERATE gene-drug interaction   Seroquel [Quetiapine] Other (See Comments)    Per "Genesight" pharmacogenomic testing, this is deemed to have MODERATE gene-drug interaction   Sinequan [Doxepin] Other (See Comments)    Per "Genesight" pharmacogenomic testing, this is deemed to have SIGNIFICANT gene-drug interaction   Thorazine [Chlorpromazine] Other (See Comments)    Per "Genesight" pharmacogenomic testing, this is deemed to have MODERATE gene-drug interaction   Tofranil [Imipramine] Other (See Comments)    Per "Genesight" pharmacogenomic testing, this is deemed to have SIGNIFICANT gene-drug interaction   Trilafon [Perphenazine] Other (See Comments)    Per "Genesight" pharmacogenomic testing, this is deemed to have MODERATE gene-drug interaction   Trintellix [Vortioxetine] Other (See Comments)    Per "Genesight" pharmacogenomic testing, this is deemed to have SIGNIFICANT gene-drug interaction   Valium [Diazepam] Other (See Comments)    Per "Genesight" pharmacogenomic testing, this is deemed to have MODERATE gene-drug interaction   Viibryd [Vilazodone Hcl] Other (See Comments)    Per "Genesight" pharmacogenomic testing, this is deemed to have MODERATE gene-drug interaction   Wellbutrin [Bupropion] Other (See Comments)    Per "Genesight" pharmacogenomic testing, this is deemed to have SIGNIFICANT gene-drug interaction   Zoloft  [Sertraline ] Other (See Comments)    Per "Genesight" pharmacogenomic testing, this is  deemed to have MODERATE gene-drug interaction   Zyprexa [Olanzapine] Other (See Comments)    Per "Genesight" pharmacogenomic testing, this is deemed to have MODERATE gene-drug interaction    Past Medical History Past Medical History:  Diagnosis Date   Allergy    Anxiety, generalized    Autism    Diagnosed at 60 months of age when mom noted he was not developing the same as his cousin   Depression    Hyperinsulinemia    eval at [facility] prior to 2024   OCD (obsessive compulsive disorder)    Tall stature    Vitamin D  deficiency     Past Surgical History Past Surgical History:  Procedure Laterality Date   NO PAST SURGERIES  2024  Family History family history includes Atrial fibrillation in his maternal grandmother; Cancer in his maternal great-grandmother; Diabetes in his maternal grandfather; Hypertension in his maternal grandfather, maternal grandmother, and mother; Hypothyroidism in his maternal grandmother; Insulin  resistance in his mother; Kidney cancer in his father; Kidney disease in his cousin and father; Kidney failure in his maternal grandfather; Pancreatitis in his father.  Social History Social History   Socioeconomic History   Marital status: Single    Spouse name: Not on file   Number of children: 0   Years of education: Not on file   Highest education level: Not on file  Occupational History   Occupation: Student  Tobacco Use   Smoking status: Never   Smokeless tobacco: Never  Vaping Use   Vaping status: Never Used  Substance and Sexual Activity   Alcohol use: Never   Drug use: Never   Sexual activity: Never  Other Topics Concern   Not on file  Social History Narrative   Lives with MGM, mom and younger brother.   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  Has phobia about insects and bugs.   High anxiety about school.  formerly [employer].   09/2022.     Social Drivers of Corporate investment banker Strain:  Not on file  Food Insecurity: Not on file  Transportation Needs: Not on file  Physical Activity: Not on file  Stress: Not on file  Social Connections: Not on file  Intimate Partner Violence: Not on file    Lab Results  Component Value Date   HGBA1C 5.5 10/09/2023   HGBA1C 5.6 06/18/2023   HGBA1C 5.7 (H) 01/23/2023   Lab Results  Component Value Date   CHOL 146 06/18/2023   Lab Results  Component Value Date   HDL 30 (L) 06/18/2023   Lab Results  Component Value Date   LDLCALC 92 06/18/2023   Lab Results  Component Value Date   TRIG 139 06/18/2023   Lab Results  Component Value Date   CHOLHDL 4.9 06/18/2023   Lab Results  Component Value Date   CREATININE 0.85 06/18/2023   No results found for: "GFR" Lab Results  Component Value Date   MICROALBUR 0.5 06/19/2023      Component Value Date/Time   NA 139 06/18/2023 0927   NA 140 01/23/2023 1212   K 4.3 06/18/2023 0927   CL 104 06/18/2023 0927   CO2 27 06/18/2023 0927   GLUCOSE 100 (H) 06/18/2023 0927   BUN 8 06/18/2023 0927   BUN 5 (L) 01/23/2023 1212   CREATININE 0.85 06/18/2023 0927   CALCIUM 9.5 06/18/2023 0927   PROT 7.2 06/18/2023 0927   PROT 7.1 10/03/2022 1012   ALBUMIN 4.4 10/03/2022 1012   AST 21 06/18/2023 0927   ALT 27 06/18/2023 0927   ALKPHOS 66 10/03/2022 1012   BILITOT 0.4 06/18/2023 0927   BILITOT 0.3 10/03/2022 1012   GFRNONAA NOT CALCULATED 12/09/2019 1937   GFRAA NOT CALCULATED 12/09/2019 1937      Latest Ref Rng & Units 06/18/2023    9:27 AM 01/23/2023   12:12 PM 10/03/2022   10:12 AM  BMP  Glucose 65 - 99 mg/dL 562  96  84   BUN 7 - 25 mg/dL 8  5  7    Creatinine 0.60 - 1.24 mg/dL 1.30  8.65  7.84   BUN/Creat Ratio 6 - 22 (calc) SEE NOTE:  5  8   Sodium  135 - 146 mmol/L 139  140  137   Potassium 3.5 - 5.3 mmol/L 4.3  4.4  4.8   Chloride 98 - 110 mmol/L 104  103  102   CO2 20 - 32 mmol/L 27  23  21    Calcium 8.6 - 10.3 mg/dL 9.5  9.5  9.4        Component Value Date/Time    WBC 6.4 10/03/2022 1012   WBC 7.7 12/09/2019 1937   RBC 5.63 10/03/2022 1012   RBC 5.03 12/09/2019 1937   HGB 15.9 10/03/2022 1012   HCT 46.7 10/03/2022 1012   PLT 372 10/03/2022 1012   MCV 83 10/03/2022 1012   MCH 28.2 10/03/2022 1012   MCH 29.0 12/09/2019 1937   MCHC 34.0 10/03/2022 1012   MCHC 32.5 12/09/2019 1937   RDW 13.3 10/03/2022 1012   LYMPHSABS 2.7 10/03/2022 1012   MONOABS 0.7 12/09/2019 1937   EOSABS 0.1 10/03/2022 1012   BASOSABS 0.0 10/03/2022 1012     Parts of this note may have been dictated using voice recognition software. There may be variances in spelling and vocabulary which are unintentional. Not all errors are proofread. Please notify the [facility] if any discrepancies are noted or if the meaning of any statement is not clear.

## 2023-10-10 NOTE — Telephone Encounter (Signed)
 Pharmacy Patient Advocate Encounter  Received notification from [employer] [facility] Medicare Part D that Prior Authorization for Rybelsus 3MG  tablets has been DENIED.  Full denial letter will be uploaded to the media tab. See denial reason below.  Drugs when used for anorexia, weight loss, or weight gain are excluded from coverage under Medicare rules.   PA #/Case ID/Reference #: [id number]

## 2024-01-07 ENCOUNTER — Ambulatory Visit: Admitting: "Endocrinology

## 2024-01-27 ENCOUNTER — Telehealth: Payer: Self-pay | Admitting: Family Medicine

## 2024-01-27 NOTE — Telephone Encounter (Signed)
 Called pt lmtrc.  Needs WTM by 11/125 and cpe.    03/02/24 afternoon a possibility.

## 2024-01-28 ENCOUNTER — Ambulatory Visit: Payer: Self-pay

## 2024-01-28 NOTE — Telephone Encounter (Signed)
 FYI Only or Action Required?: FYI only for provider.  Patient was last seen in primary care on 05/10/2023 by Early, Camie BRAVO, NP.  Called Nurse Triage reporting Otalgia.  Symptoms began several days ago.  Interventions attempted: Nothing.  Symptoms are: stable.  Triage Disposition: See Within 3 Days in Office (overriding See Physician Within 24 Hours)  Patient/caregiver understands and will follow disposition?: Yes    Copied from CRM [id number]. Topic: Clinical - Red Word Triage >> Jan 28, 2024  2:25 PM Fonda T wrote: [facility] that prompted transfer to Nurse Triage: Received call from mother of patient, states patient is having ear pain, and noticed a rash on his hand, and vomiting episodes.  Patient mother states she has medical concerns since the passing of patient father. Reason for Disposition  Earache  (Exceptions: Brief ear pain of lasting less than 60 minutes, or earache occurring during air travel.)    Patient has autism and mom states she is unsure if symptoms are inner ear or is external due to a bump on the skin.  Answer Assessment - Initial Assessment Questions Patient not available at time and spoke with mom concerning symptoms. Mom states she is also concerned about patient's vomiting episodes. May have a bump near area. Pt also has a rash on hand near thumb and area looks irritated.    1. LOCATION: Which ear is involved?     Unsure, believes it is his L ear  2. ONSET: When did the ear pain start?      Patient mentioned symptoms Sunday  3. SEVERITY: How bad is the pain?  (Scale 1-10; mild, moderate or severe)     Mild  4. URI SYMPTOMS: Do you have a runny nose or cough?     Denies  5. FEVER: Do you have a fever? If Yes, ask: What is your temperature, how was it measured, and when did it start?     Mom states he has been clammy and sweaty.  6. CAUSE: Have you been swimming recently?, How often do you use Q-TIPS?, Have you had any recent air travel or  scuba diving?     Mom mentioned that dad had hx of swimmer's ear.  7. OTHER SYMPTOMS: Do you have any other symptoms? (e.g., decreased hearing, dizziness, headache, stiff neck, vomiting)     Has episodes of vomiting that is explosive ongoing for awhile Mom wants to mention this to provider.  Protocols used: Rilla

## 2024-01-31 ENCOUNTER — Ambulatory Visit: Admitting: Medical

## 2024-01-31 ENCOUNTER — Other Ambulatory Visit: Payer: Self-pay | Admitting: Medical

## 2024-01-31 VITALS — BP 110/72 | HR 73 | Temp 98.4°F | Wt 300.6 lb

## 2024-01-31 DIAGNOSIS — R21 Rash and other nonspecific skin eruption: Secondary | ICD-10-CM

## 2024-01-31 DIAGNOSIS — H9325 Central auditory processing disorder: Secondary | ICD-10-CM

## 2024-01-31 DIAGNOSIS — F840 Autistic disorder: Secondary | ICD-10-CM

## 2024-01-31 DIAGNOSIS — E559 Vitamin D deficiency, unspecified: Secondary | ICD-10-CM

## 2024-01-31 DIAGNOSIS — J029 Acute pharyngitis, unspecified: Secondary | ICD-10-CM

## 2024-01-31 DIAGNOSIS — Z23 Encounter for immunization: Secondary | ICD-10-CM

## 2024-01-31 DIAGNOSIS — R7301 Impaired fasting glucose: Secondary | ICD-10-CM | POA: Diagnosis not present

## 2024-01-31 DIAGNOSIS — Z79899 Other long term (current) drug therapy: Secondary | ICD-10-CM

## 2024-01-31 DIAGNOSIS — R112 Nausea with vomiting, unspecified: Secondary | ICD-10-CM

## 2024-01-31 MED ORDER — OMEPRAZOLE 40 MG PO CPDR: 40.0000 mg | DELAYED_RELEASE_CAPSULE | Freq: Every day | ORAL | 0 refills | Status: DC

## 2024-01-31 MED ORDER — CLOTRIMAZOLE-BETAMETHASONE 1-0.05 % EX CREA: 1.0000 | TOPICAL_CREAM | Freq: Every day | CUTANEOUS | 0 refills | Status: AC

## 2024-01-31 MED ORDER — VITAMIN D (ERGOCALCIFEROL) 1.25 MG (50000 UNIT) PO CAPS: 50000.0000 [IU] | ORAL_CAPSULE | ORAL | 1 refills | Status: AC

## 2024-01-31 MED ORDER — LEVOCETIRIZINE DIHYDROCHLORIDE 5 MG PO TABS: 5.0000 mg | ORAL_TABLET | Freq: Every day | ORAL | 2 refills | Status: DC

## 2024-01-31 NOTE — Progress Notes (Signed)
 Name: Christopher [name]   Date of Visit: 01/31/24   Date of last visit with me: Visit date not found   CHIEF COMPLAINT:  Chief Complaint  Patient presents with   Acute Visit    Left ear pain and rash on left hand. Patient has had several episodes where is feels like pink throat and will throw up a lot and lasting several weeks. Would like flu shot today       HPI:  Discussed the use of AI scribe software for clinical note transcription with the patient, who gave verbal consent to proceed.  History of Present Illness   Christopher [name] is a 23 year old male who presents with mother.   He has hx/o autism.  He is having rash, episodes of vomiting, ear pain, and throat discomfort.  He experiences episodes of vomiting approximately twice a week, preceded by a sensation he describes as 'pink throat.' Each episode involves significant amounts of vomit, occurring two to three times per episode, and he feels better afterward. This pattern has been ongoing for some time. No diarrhea is reported, but he may experience stomach pain before vomiting.  He reports pain in his left ear, which is not associated with a runny nose, headache, or fever. He sometimes experiences a cough but denies any drainage from the ear.  A rash was noticed on his left wrist and hand area about four days ago. No treatment has been applied yet. The rash was initially noticed a couple of days before Sunday.  His current medications include metformin , which he takes four times a day. He has previously been on prescription strength vitamin D , which he has not taken recently. His mother is unsure if he is currently taking Xyzal , an allergy medication, as it was prescribed last December and may have expired. He also uses Flonase nasal spray.   Past Medical History:  Diagnosis Date   Allergy    Anxiety, generalized    Autism    Diagnosed at 28 months of age when mom noted he was not developing the same as his cousin    Depression    Hyperinsulinemia    eval at [facility] prior to 2024   OCD (obsessive compulsive disorder)    Tall stature    Vitamin D  deficiency    Current Outpatient Medications on File Prior to Visit  Medication Sig Dispense Refill   fluticasone (FLONASE) 50 MCG/ACT nasal spray Place 1 spray into both nostrils as needed.     metFORMIN  (GLUCOPHAGE ) 500 MG tablet Take 2 tablets (1,000 mg total) by mouth 2 (two) times daily with a meal. 120 tablet 3   methylcellulose (CITRUCEL) oral powder Take 1 packet by mouth daily.     polyethylene glycol powder (GLYCOLAX /MIRALAX ) 17 GM/SCOOP powder Take 17 g by mouth daily.     propranolol (INDERAL) 10 MG tablet Take 5 mg by mouth 2 (two) times daily.     sertraline  (ZOLOFT ) 50 MG tablet Take 25 mg by mouth daily.     AMBULATORY NON FORMULARY MEDICATION Medication Name- Nitroglycerin gel 0.125% TID PR 30 g 2   Blood Glucose Monitoring Suppl DEVI 1 each by Does not apply route in the morning, at noon, and at bedtime. May substitute to any manufacturer covered by patient's insurance. 1 each 0   No current facility-administered medications on file prior to visit.   ROS as in subjective   Physical Exam Gen: wd, wn, nad HEENT: Left ear canal slightly irritated, eardrum  normal. Redness in conjunctiva bilateral. Throat normal.  Otherwise HEENT unremarkable Neck supple nontender, no lymphadenopathy, no masses Skin: Dorsal medial wrist and hand with a approximately 3 cm round somewhat hypopigmented patch of skin with some scabbing and excoriation Abdomen: Nontender, no mass,no organomegaly   Results LABS Hemoglobin A1c: 5.5 (09/2023)      Assessment  Encounter Diagnoses  Name Primary?   Rash    Needs flu shot Yes   Sore throat    Impaired fasting blood sugar    Vitamin D  deficiency    Autism spectrum    Auditory processing disorder    Nausea and vomiting, unspecified vomiting type    High risk medication use      Plan Rash on left  wrist/hand Rash with hypopigmented areas suggestive of ringworm. - Prescribed Lotrisone  cream daily for 1-2 weeks. Discontinue if resolved in a week. Contact provider if no change after 7-10 days. - Advised against applying cream on the face. - Recommended daily moisturizing lotion at bedtime for arms and face.  Possibly Metformin -induced gastrointestinal symptoms Intermittent vomiting and abdominal pain likely due to high metformin  dosage. - Reduced metformin  to 500 mg twice daily. - Prescribed omeprazole  daily before breakfast for one month. - Reassess symptoms in 3-4 weeks for potential further testing or gastroenterology referral.  Impaired fasting glucose, insulin  abnormality Managed with metformin . Recent hemoglobin A1c was 5.5%. Decrease from 1000 mg twice daily to 500 mg twice daily.  Allergic rhinitis Symptoms suggest seasonal allergy component. Not taking Xyzal  despite previous prescription. - Refilled Xyzal  for daily use at night for 30 days with refills until mid-October and again in spring. - Advised use of Flonase nasal spray in the morning.  Vitamin D  deficiency Low vitamin D  levels. - Refilled prescription strength vitamin D  for weekly use.  Dental care Dental care overdue, difficulty finding a dentist accepting current insurance. - Encouraged contacting insurance to find a dentist within network. - Provided contact information for Dr. Alm Chou as a potential dental provider.  [name] was seen today for acute visit.  Diagnoses and all orders for this visit:  Needs flu shot -     Flu vaccine trivalent PF, 6mos and older(Flulaval,Afluria,Fluarix,Fluzone)  Rash  Sore throat -     levocetirizine (XYZAL ) 5 MG tablet; Take 1 tablet (5 mg total) by mouth at bedtime. Take for 1 week then may be used as needed for allergy symptoms.  Impaired fasting blood sugar  Vitamin D  deficiency  Autism spectrum  Auditory processing disorder  Nausea and vomiting,  unspecified vomiting type  High risk medication use  Other orders -     clotrimazole -betamethasone  (LOTRISONE ) cream; Apply 1 Application topically daily. -     Vitamin D , Ergocalciferol , (DRISDOL ) 1.25 MG (50000 UNIT) CAPS capsule; Take 1 capsule (50,000 Units total) by mouth every 7 (seven) days. -     omeprazole  (PRILOSEC) 40 MG capsule; Take 1 capsule (40 mg total) by mouth daily. In morning preferably 30-45 min before breakfast    Follow up in 1 month

## 2024-01-31 NOTE — Patient Instructions (Addendum)
 DENTIST RECOMMENDATION Dr. Alm Chou, dentist [address], [place], [place] [address] [phone] [email or web address]  Rash on left wrist/hand Rash with hypopigmented areas suggestive of ringworm. - Prescribed Lotrisone  cream daily for 1-2 weeks. Discontinue if resolved in a week. Contact provider if no change after 7-10 days. - Advised against applying cream on the face. - Recommended daily moisturizing lotion at bedtime for arms and face.  Possibly Metformin -induced gastrointestinal symptoms Intermittent vomiting and abdominal pain likely due to high metformin  dosage. - Reduced metformin  to 500 mg twice daily. - Prescribed omeprazole  daily before breakfast for one month. - Reassess symptoms in 3-4 weeks for potential further testing or gastroenterology referral.  Impaired fasting glucose, insulin  abnormality Managed with metformin . Recent hemoglobin A1c was 5.5%. Decrease from 1000 mg twice daily to 500 mg twice daily.  Allergic rhinitis Symptoms suggest seasonal allergy component. Not taking Xyzal  despite previous prescription. - Refilled Xyzal  for daily use at night for 30 days with refills until mid-October and again in spring. - Advised use of Flonase nasal spray in the morning.  Vitamin D  deficiency Low vitamin D  levels. - Refilled prescription strength vitamin D  for weekly use.  Dental care Dental care overdue, difficulty finding a dentist accepting current insurance. - Encouraged contacting insurance to find a dentist within network. - Provided contact information for Dr. Alm Chou as a potential dental provider.

## 2024-02-19 ENCOUNTER — Ambulatory Visit: Admitting: "Endocrinology

## 2024-02-26 ENCOUNTER — Ambulatory Visit: Admitting: Medical

## 2024-03-07 ENCOUNTER — Other Ambulatory Visit: Payer: Self-pay | Admitting: Medical

## 2024-04-21 ENCOUNTER — Ambulatory Visit: Admitting: Family Medicine

## 2024-04-21 ENCOUNTER — Ambulatory Visit: Payer: Self-pay

## 2024-04-21 VITALS — BP 140/80 | HR 86 | Wt 299.2 lb

## 2024-04-21 DIAGNOSIS — J302 Other seasonal allergic rhinitis: Secondary | ICD-10-CM

## 2024-04-21 DIAGNOSIS — J069 Acute upper respiratory infection, unspecified: Secondary | ICD-10-CM

## 2024-04-21 DIAGNOSIS — J029 Acute pharyngitis, unspecified: Secondary | ICD-10-CM | POA: Diagnosis not present

## 2024-04-21 DIAGNOSIS — L409 Psoriasis, unspecified: Secondary | ICD-10-CM | POA: Diagnosis not present

## 2024-04-21 MED ORDER — FLUTICASONE PROPIONATE 50 MCG/ACT NA SUSP: 2.0000 | Freq: Every day | NASAL | 6 refills | Status: AC

## 2024-04-21 MED ORDER — LEVOCETIRIZINE DIHYDROCHLORIDE 5 MG PO TABS: 5.0000 mg | ORAL_TABLET | Freq: Every day | ORAL | 2 refills | Status: AC

## 2024-04-21 NOTE — Telephone Encounter (Signed)
 FYI Only or Action Required?: FYI only for provider: appointment scheduled on 04/21/24.  Patient was last seen in primary care on 01/31/2024 by Bulah Alm RAMAN, PA-C.  Called Nurse Triage reporting Sore Throat.  Symptoms began several days ago.  Interventions attempted: Nothing.  Symptoms are: unchanged.  Triage Disposition: See Physician Within 24 Hours  Patient/caregiver understands and will follow disposition?: Yes    Copied from CRM #[id number]. Topic: Clinical - Red Word Triage >> Apr 21, 2024  9:03 AM Selinda RAMAN wrote: Red Word that prompted transfer to Nurse Triage: Anidra the mother of the patient called in stating the patient has had abdominal pain, bad diarrhea, emesis, nausea and loss of appetite for several days. I will transfer her to E2C2 NT. Reason for Disposition  SEVERE throat pain (e.g., excruciating)  Answer Assessment - Initial Assessment Questions No available appts today with pcp, scheduled 04/21/24.  Advised call back or ED if symptoms worsen.  Patient's mother concerns of family kidney cancer; pt's father passed; patient has similar symptoms, previously discussed with MD.  1. ONSET: When did the throat start hurting? (Hours or days ago)      Pt's mom reports nasal congestion, sore throat,  Vomited-last 24 hours; 2x, small emesis; unsure color and contents; thinks he may vomit to get rid of phlegm Diarrhea-last 24 hours; 2-3 x, pt's mom reports did not see stool, but heard Drinking without difficulties Metformin  may be causing upset stomach; unsure of blood sugar; does not take 2. SEVERITY: How bad is the sore throat? (Scale 1-10; mild, moderate or severe)     mild 3. STREP EXPOSURE: Has there been any exposure to strep within the past week? If Yes, ask: What type of contact occurred?      unsure 4.  VIRAL SYMPTOMS: Are there any symptoms of a cold, such as a runny nose, cough, hoarse voice or red eyes?      Denies runny nose, cough Nasal  congestion, post nasal drainage 5. FEVER: Do you have a fever? If Yes, ask: What is your temperature, how was it measured, and when did it start?     denies 6. PUS ON THE TONSILS: Is there pus on the tonsils in the back of your throat?     unsure 7. OTHER SYMPTOMS: Do you have any other symptoms? (e.g., difficulty breathing, headache, rash)     denies  Protocols used: Sore Throat-A-AH

## 2024-04-21 NOTE — Progress Notes (Signed)
 Name: Christopher [name]   Date of Visit: 04/21/24   Date of last visit with me: Visit date not found   CHIEF COMPLAINT:  Chief Complaint  Patient presents with   Acute Visit    Saturday, pain in chest, upper stomach area. Thrown up a couple of times, gas, diarrhea.        HPI:  Discussed the use of AI scribe software for clinical note transcription with the patient, who gave verbal consent to proceed.  History of Present Illness   Christopher [name] is a 23 year old male with autism who presents with cough and sore throat.  He has been experiencing symptoms since Saturday, including a cough and sore throat. Initially, he had pain in his upper chest and upper abdomen, along with a lack of appetite, and he slept most of the day on Saturday. On Sunday, his appetite remained poor, and he continued to experience a sore throat, nausea, and increased flatulence. By Monday, he developed diarrhea, remained very gassy, and vomited two to three times. He continued to complain of a sore throat and abdominal pain, although he had periods where he felt somewhat better. Today, he managed to eat breakfast and appears more congested, but there is a slight improvement in his overall condition.  He has not been taking Xyzal , an allergy medication, since mid-October. He has been using cold and flu medicine, which has provided limited relief. Due to his autism, he often vomits instead of being able to expectorate mucus.  His caregiver is concerned about the symptoms due to a family history of his father passing away from kidney cancer in 2023.         OBJECTIVE:       07/05/2022   10:32 AM  Depression screen PHQ 2/9  Decreased Interest 0  Down, Depressed, Hopeless 1  PHQ - 2 Score 1     BP Readings from Last 3 Encounters:  04/21/24 (!) 140/80  01/31/24 110/72  10/09/23 124/80    BP (!) 140/80   Pulse 86   Wt 299 lb 3.2 oz (135.7 kg)   SpO2 98%   BMI 36.90 kg/m    Physical Exam           Physical Exam Constitutional:      Appearance: Normal appearance.  HENT:     Right Ear: There is no impacted cerumen.     Left Ear: There is no impacted cerumen.     Nose: Congestion and rhinorrhea present.     Mouth/Throat:     Pharynx: Posterior oropharyngeal erythema present. No oropharyngeal exudate.  Pulmonary:     Effort: Pulmonary effort is normal. No respiratory distress.     Breath sounds: No wheezing.  Musculoskeletal:        General: Normal range of motion.  Neurological:     General: No focal deficit present.     Mental Status: He is alert and oriented to person, place, and time. Mental status is at baseline.     ASSESSMENT/PLAN:   Assessment & Plan Viral URI with cough  Sore throat  Seasonal allergic rhinitis, unspecified trigger  Psoriasis of scalp    Assessment and Plan    Acute upper respiratory infection and acute pharyngitis Likely viral etiology with superimposed allergic rhinitis. Symptoms expected to improve, but cough may persist due to allergies. - Use Flonase nasal spray. - Use Xyzal . - Use a humidifier in his room. - Monitor symptoms and report if not improved by early  next week.  Seasonal allergic rhinitis Contributing to prolonged cough. Exacerbated by recent viral infection. Treatment aimed at reducing allergic symptoms. - Use Flonase nasal spray. - Use Xyzal . - Use a humidifier in his room.         Veryl Winemiller A. Vita MD [facility]
